# Patient Record
Sex: Female | Born: 1955 | Race: White | Hispanic: No | Marital: Single | Smoking: Never smoker
Health system: Southern US, Community
[De-identification: ages and names within clinical notes are randomized; demographics above are authoritative.]

## PROBLEM LIST (undated history)

## (undated) DIAGNOSIS — K219 Gastro-esophageal reflux disease without esophagitis: Secondary | ICD-10-CM

## (undated) DIAGNOSIS — K429 Umbilical hernia without obstruction or gangrene: Secondary | ICD-10-CM

## (undated) DIAGNOSIS — C801 Malignant (primary) neoplasm, unspecified: Secondary | ICD-10-CM

## (undated) DIAGNOSIS — J45909 Unspecified asthma, uncomplicated: Secondary | ICD-10-CM

## (undated) DIAGNOSIS — K439 Ventral hernia without obstruction or gangrene: Secondary | ICD-10-CM

## (undated) DIAGNOSIS — D219 Benign neoplasm of connective and other soft tissue, unspecified: Secondary | ICD-10-CM

## (undated) DIAGNOSIS — Z862 Personal history of diseases of the blood and blood-forming organs and certain disorders involving the immune mechanism: Secondary | ICD-10-CM

## (undated) DIAGNOSIS — F32A Depression, unspecified: Secondary | ICD-10-CM

## (undated) DIAGNOSIS — N840 Polyp of corpus uteri: Secondary | ICD-10-CM

## (undated) DIAGNOSIS — K5790 Diverticulosis of intestine, part unspecified, without perforation or abscess without bleeding: Secondary | ICD-10-CM

## (undated) DIAGNOSIS — Z8679 Personal history of other diseases of the circulatory system: Secondary | ICD-10-CM

## (undated) DIAGNOSIS — F329 Major depressive disorder, single episode, unspecified: Secondary | ICD-10-CM

## (undated) DIAGNOSIS — T7840XA Allergy, unspecified, initial encounter: Secondary | ICD-10-CM

## (undated) HISTORY — DX: Gastro-esophageal reflux disease without esophagitis: K21.9

## (undated) HISTORY — PX: COLONOSCOPY: SHX174

## (undated) HISTORY — DX: Unspecified asthma, uncomplicated: J45.909

## (undated) HISTORY — PX: VARICOSE VEIN SURGERY: SHX832

## (undated) HISTORY — DX: Allergy, unspecified, initial encounter: T78.40XA

## (undated) HISTORY — PX: DILATION AND CURETTAGE OF UTERUS: SHX78

## (undated) HISTORY — PX: TONSILLECTOMY: SUR1361

## (undated) HISTORY — PX: INCISION AND DRAINAGE ABSCESS: SHX5864

## (undated) HISTORY — DX: Malignant (primary) neoplasm, unspecified: C80.1

## (undated) HISTORY — PX: MOHS SURGERY: SUR867

---
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
## 2004-10-11 ENCOUNTER — Other Ambulatory Visit: Admission: RE | Admit: 2004-10-11 | Discharge: 2004-10-11 | Payer: Self-pay | Admitting: Obstetrics and Gynecology

## 2004-10-23 ENCOUNTER — Ambulatory Visit (HOSPITAL_COMMUNITY): Admission: RE | Admit: 2004-10-23 | Discharge: 2004-10-23 | Payer: Self-pay | Admitting: Obstetrics and Gynecology

## 2004-10-23 ENCOUNTER — Ambulatory Visit (HOSPITAL_BASED_OUTPATIENT_CLINIC_OR_DEPARTMENT_OTHER): Admission: RE | Admit: 2004-10-23 | Discharge: 2004-10-23 | Payer: Self-pay | Admitting: Obstetrics and Gynecology

## 2012-02-18 ENCOUNTER — Ambulatory Visit: Payer: BC Managed Care – PPO

## 2012-02-18 ENCOUNTER — Ambulatory Visit (INDEPENDENT_AMBULATORY_CARE_PROVIDER_SITE_OTHER): Payer: BC Managed Care – PPO | Admitting: Family Medicine

## 2012-02-18 VITALS — BP 97/64 | HR 98 | Temp 99.4°F | Resp 20 | Ht 63.5 in | Wt 175.2 lb

## 2012-02-18 DIAGNOSIS — R0902 Hypoxemia: Secondary | ICD-10-CM

## 2012-02-18 DIAGNOSIS — R062 Wheezing: Secondary | ICD-10-CM

## 2012-02-18 DIAGNOSIS — J189 Pneumonia, unspecified organism: Secondary | ICD-10-CM

## 2012-02-18 MED ORDER — METHYLPREDNISOLONE ACETATE 80 MG/ML IJ SUSP
80.0000 mg | Freq: Once | INTRAMUSCULAR | Status: AC
  Administered 2012-02-18: 80 mg via INTRAMUSCULAR

## 2012-02-18 MED ORDER — LEVOFLOXACIN 500 MG PO TABS: 500.0000 mg | ORAL_TABLET | Freq: Every day | ORAL | Status: AC

## 2012-02-18 MED ORDER — ALBUTEROL SULFATE (5 MG/ML) 0.5% IN NEBU
2.5000 mg | INHALATION_SOLUTION | Freq: Once | RESPIRATORY_TRACT | Status: AC
  Administered 2012-02-18: 2.5 mg via RESPIRATORY_TRACT

## 2012-02-18 MED ORDER — IPRATROPIUM BROMIDE 0.02 % IN SOLN
0.5000 mg | Freq: Once | RESPIRATORY_TRACT | Status: AC
  Administered 2012-02-18: 0.5 mg via RESPIRATORY_TRACT

## 2012-02-18 NOTE — Progress Notes (Signed)
  Subjective:    Patient ID: Jody [name], female    DOB: 24-Feb-1956, 56 y.o.   MRN: [id number]  HPI 56 yo female with history of allergic wheezing/asthma here with cough, wheezing for 2 days.  Started yesterday or day before with some clear runny nose, ear pressure, tickly throat, and cough.  Worsening today and wheezing.  Short of breath today - though feeling a little better now.  Has ventolin inhaler from previous illness but has not tried using it.    Review of Systems Negative except as per HPI     Objective:   Physical Exam  Constitutional: She appears well-developed and well-nourished.  Cardiovascular: Normal rate, regular rhythm, normal heart sounds and intact distal pulses.   No murmur heard. Pulmonary/Chest: Effort normal and breath sounds normal.       Apparent audible wheezing when talking, but on ausculatation, very few wheezes and only mildly decreased breath sounds.  Given one neb treatment of albuterol and atrovent and pulsox up to 92%.   Neurological: She is alert.  Skin: Skin is warm and dry.   [facility] Primary radiology reading by Dr. Georgiana Shore: Increased markings in left lingular and rml area - ? Early pneumonia       Assessment & Plan:  Cough, wheeze, SOB, hypoxia - probably early pneumonia on xray. Depomedrol here for the wheezing and hypoxia.  Levaquin 500 #7 for pneumonia.  F/U in 48 hours if not improving, sooner if worse.

## 2012-11-28 ENCOUNTER — Ambulatory Visit (INDEPENDENT_AMBULATORY_CARE_PROVIDER_SITE_OTHER): Payer: BC Managed Care – PPO | Admitting: Emergency Medicine

## 2012-11-28 VITALS — BP 121/76 | HR 85 | Temp 98.3°F | Resp 16 | Ht 64.0 in | Wt 183.0 lb

## 2012-11-28 DIAGNOSIS — I8390 Asymptomatic varicose veins of unspecified lower extremity: Secondary | ICD-10-CM

## 2012-11-28 NOTE — Progress Notes (Signed)
[facility] [address], [place] [address] [phone]  Date:  11/28/2012   Name:  Jody [name]   DOB:  December 02, 1955   MRN:  [id number]  PCP:  No primary provider on file.    Chief Complaint: Varicose Veins   History of Present Illness:  Jody [name] is a 57 y.o. very pleasant female patient who presents with the following:  Had a spontaneous bleed from a varicosity on her left ankle that she was able to stop with pressure.  Now not bleeding.  No other complaints or concerns  There is no problem list on file for this patient.   Past Medical History  Diagnosis Date  . Allergy   . Cancer     basil cell carcinoma  . GERD (gastroesophageal reflux disease)     Past Surgical History  Procedure Laterality Date  . Tonsillectomy      57 yrs old  . Dilation and curettage of uterus      History  Substance Use Topics  . Smoking status: Never Smoker   . Smokeless tobacco: Not on file  . Alcohol Use: Yes     Comment: once or twice a year    Family History  Problem Relation Age of Onset  . Diabetes Mother   . Hypertension Mother   . Diabetes Father   . Hypertension Father     Allergies  Allergen Reactions  . Aspirin     Bleeding  . Penicillins Hives  . Sulfa Antibiotics Hives    Medication list has been reviewed and updated.  Current Outpatient Prescriptions on File Prior to Visit  Medication Sig Dispense Refill  . Ascorbic Acid (VITAMIN C) 1000 MG tablet Take 1,000 mg by mouth daily. Not sure of dose      . calcium citrate-vitamin D 200-200 MG-UNIT TABS Take 1 tablet by mouth daily.      . Probiotic Product (PROBIOTIC PO) Take by mouth. Ultra-flora balance       No current facility-administered medications on file prior to visit.    Review of Systems:  As per HPI, otherwise negative.    Physical Examination: Filed Vitals:   11/28/12 1504  BP: 121/76  Pulse: 85  Temp: 98.3 F (36.8 C)  Resp: 16   Filed  Vitals:   11/28/12 1504  Height: 5\' 4"  (1.626 m)  Weight: 183 lb (83.008 kg)   Body mass index is 31.4 kg/(m^2). Ideal Body Weight: Weight in (lb) to have BMI = 25: 145.3   GEN: WDWN, NAD, Non-toxic, Alert & Oriented x 3 HEENT: Atraumatic, Normocephalic.  Ears and Nose: No external deformity. EXTR: No clubbing/cyanosis/edema.   Extensive varicosities NEURO: Normal gait.  PSYCH: Normally interactive. Conversant. Not depressed or anxious appearing.  Calm demeanor.    Assessment and Plan: Ruptured varicose vein Follow up with vein clinic  Carmelina Dane, MD

## 2012-12-09 ENCOUNTER — Ambulatory Visit (INDEPENDENT_AMBULATORY_CARE_PROVIDER_SITE_OTHER): Payer: BC Managed Care – PPO | Admitting: Family Medicine

## 2012-12-09 VITALS — BP 109/70 | HR 85 | Temp 98.3°F | Resp 16 | Ht 64.0 in | Wt 184.0 lb

## 2012-12-09 DIAGNOSIS — J302 Other seasonal allergic rhinitis: Secondary | ICD-10-CM | POA: Insufficient documentation

## 2012-12-09 DIAGNOSIS — J309 Allergic rhinitis, unspecified: Secondary | ICD-10-CM

## 2012-12-09 MED ORDER — FLUTICASONE PROPIONATE 50 MCG/ACT NA SUSP: 2.0000 | Freq: Every day | NASAL | Status: DC

## 2012-12-09 NOTE — Patient Instructions (Addendum)
Thank you for coming in today.  I think your symptoms are due to allergies.  Please take Flonase spray for a month.  Take zyrtec daily during the bad allergy times.  Call or go to the emergency room if you get worse, have trouble breathing, have chest pains, or palpitations.  Come back as needed.

## 2012-12-09 NOTE — Progress Notes (Signed)
Jody [name] is a 57 y.o. female who presents to [facility] today for cough, runny nose, sore throat present for several days. Patient notes no fevers shortness of breath chest pain or palpitations. She has tried some Mucinex which has helped a bit. She feels well otherwise. She has a past medical history significant for seasonal allergies. Zyrtec has worked well in the past.   PMH: Reviewed seasonal allergies History  Substance Use Topics  . Smoking status: Never Smoker   . Smokeless tobacco: Not on file  . Alcohol Use: Yes     Comment: once or twice a year   ROS as above  Medications reviewed. Current Outpatient Prescriptions  Medication Sig Dispense Refill  . Ascorbic Acid (VITAMIN C) 1000 MG tablet Take 1,000 mg by mouth daily. Not sure of dose      . calcium citrate-vitamin D 200-200 MG-UNIT TABS Take 1 tablet by mouth daily.      . Cholecalciferol (VITAMIN D) 2000 UNITS tablet Take 2,000 Units by mouth daily.      . Multiple Vitamins-Minerals (CENTRUM SILVER ULTRA WOMENS PO) Take 1 tablet by mouth daily.      . Probiotic Product (PROBIOTIC PO) Take by mouth. Ultra-flora balance      . fluticasone (FLONASE) 50 MCG/ACT nasal spray Place 2 sprays into the nose daily.  16 g  6  . Vitamin Mixture (ESTER-C PO) Take 1 tablet by mouth daily.       No current facility-administered medications for this visit.    Exam:  BP 109/70  Pulse 85  Temp(Src) 98.3 F (36.8 C) (Oral)  Resp 16  Ht 5\' 4"  (1.626 m)  Wt 184 lb (83.462 kg)  BMI 31.57 kg/m2  SpO2 99% Gen: Well NAD HEENT: EOMI,  MMM, cobblestoning posterior pharynx. Normal-appearing tympanic membranes bilaterally. Nontender sinus Lungs: CTABL Nl WOB Heart: RRR no MRG Abd: NABS, NT, ND Exts: Non edematous BL  LE, warm and well perfused.   No results found for this or any previous visit (from the past 72 hour(s)).  Assessment and Plan: 57 y.o. female with seasonal allergies versus viral URI. Symptomatic treatment. Flonase  Zyrtec Call or go to the emergency room if you get worse, have trouble breathing, have chest pains, or palpitations.  Followup as needed

## 2013-05-16 IMAGING — CR DG CHEST 2V
2 series · 2 of 2 positions shown · non-contrast
Comparison: None.

CLINICAL DATA: Cough, wheeze

CHEST - 2 VIEW

[PA]
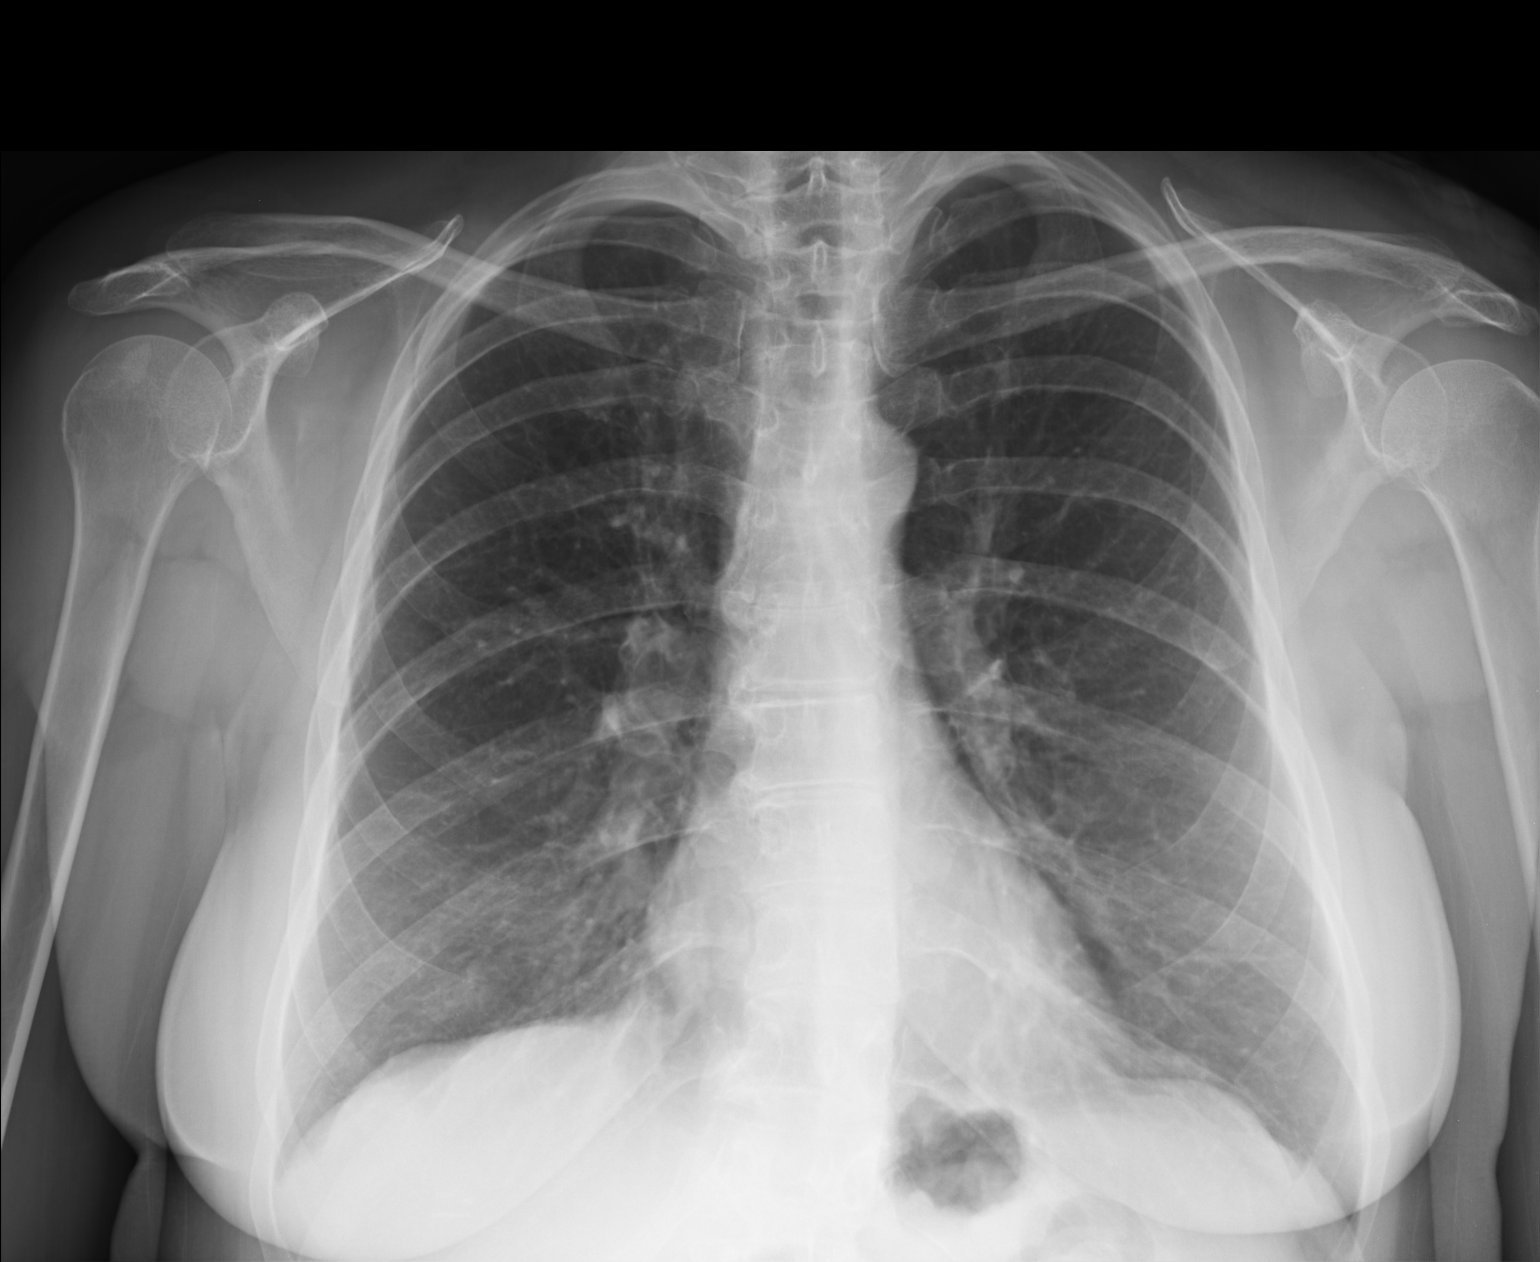

[lateral]
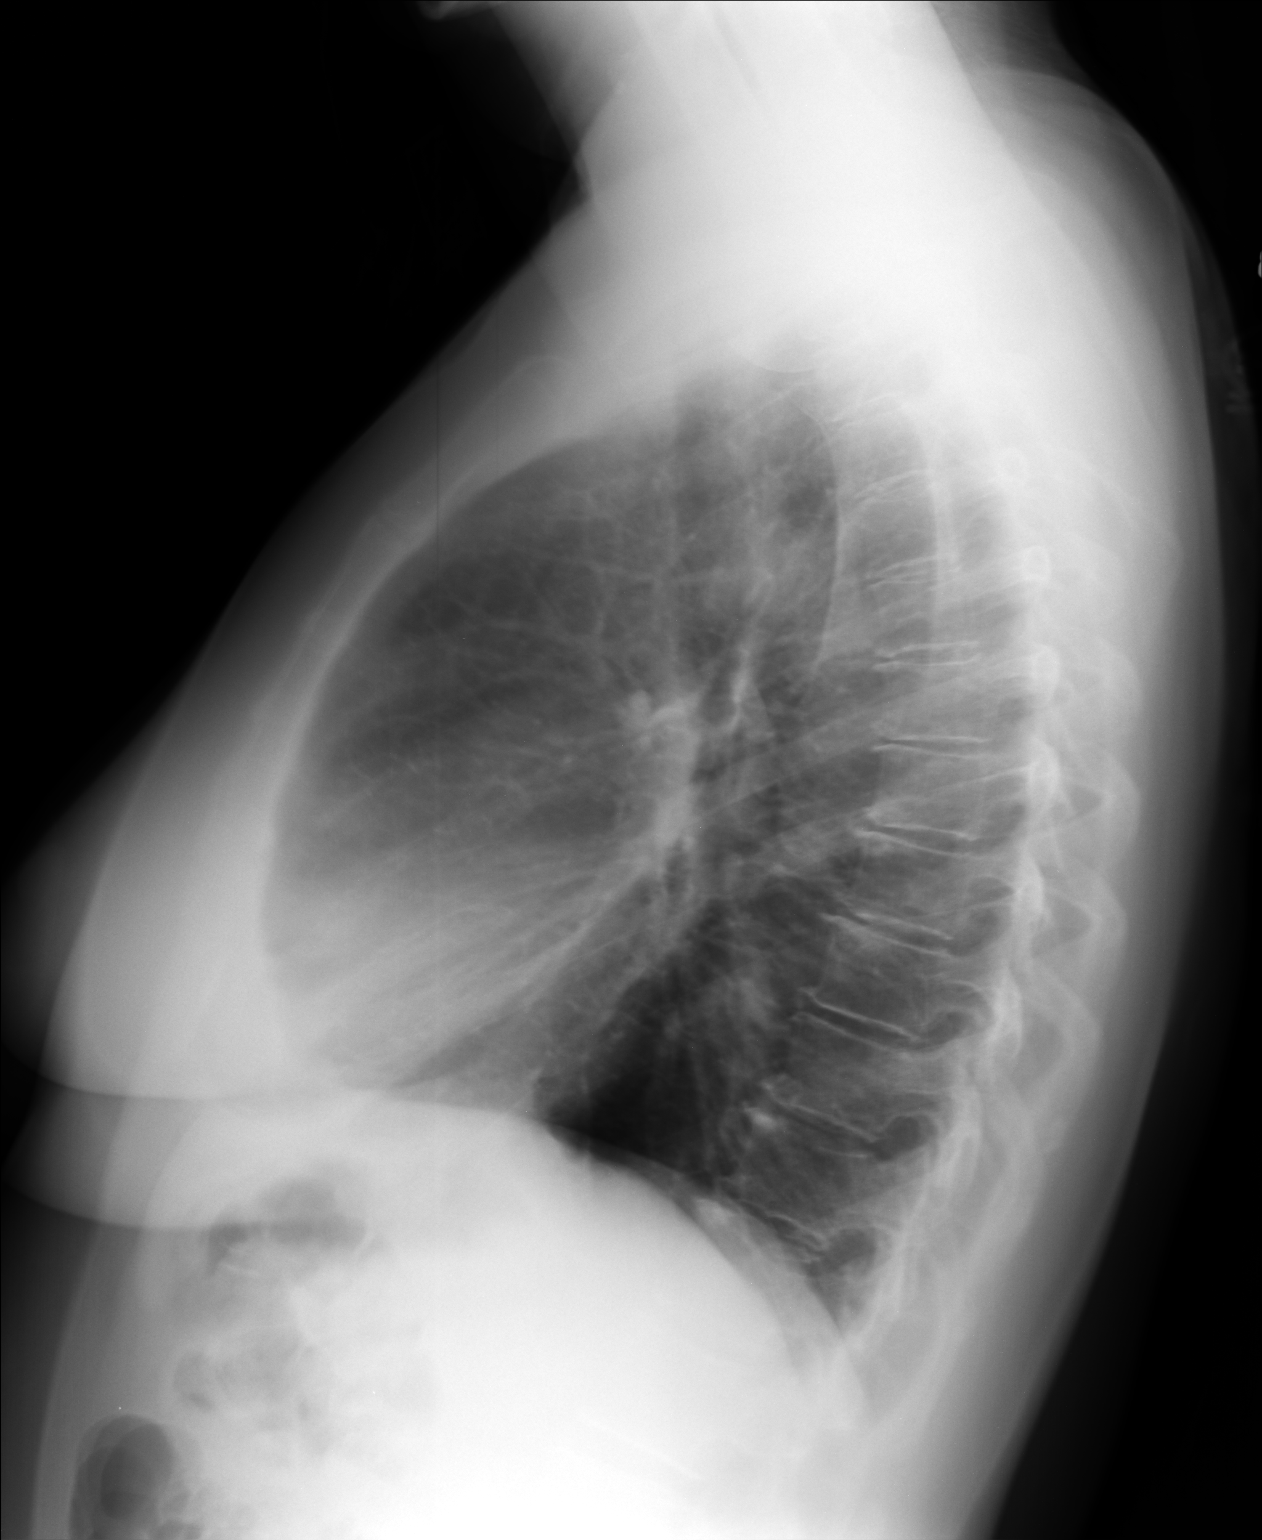

[2 of 2 positions shown; findings below may reference images not displayed]

FINDINGS: Mild coarse infrahilar interstitial markings.  No confluent
airspace infiltrate or overt edema.  Heart size is normal.  No
effusion.  Regional bones unremarkable.
IMPRESSION: 1.  Prominent infrahilar interstitial markings, of uncertain
chronicity.

Clinically significant discrepancy from primary report, if
provided: None

## 2013-07-11 ENCOUNTER — Ambulatory Visit (INDEPENDENT_AMBULATORY_CARE_PROVIDER_SITE_OTHER): Payer: BC Managed Care – PPO | Admitting: Physician Assistant

## 2013-07-11 VITALS — BP 112/82 | HR 66 | Temp 98.0°F | Resp 16 | Ht 63.0 in | Wt 182.4 lb

## 2013-07-11 DIAGNOSIS — J302 Other seasonal allergic rhinitis: Secondary | ICD-10-CM

## 2013-07-11 DIAGNOSIS — R062 Wheezing: Secondary | ICD-10-CM

## 2013-07-11 DIAGNOSIS — J45909 Unspecified asthma, uncomplicated: Secondary | ICD-10-CM | POA: Insufficient documentation

## 2013-07-11 DIAGNOSIS — J309 Allergic rhinitis, unspecified: Secondary | ICD-10-CM

## 2013-07-11 DIAGNOSIS — I8393 Asymptomatic varicose veins of bilateral lower extremities: Secondary | ICD-10-CM | POA: Insufficient documentation

## 2013-07-11 MED ORDER — BECLOMETHASONE DIPROPIONATE 40 MCG/ACT IN AERS: 2.0000 | INHALATION_SPRAY | Freq: Two times a day (BID) | RESPIRATORY_TRACT | Status: DC

## 2013-07-11 MED ORDER — ALBUTEROL SULFATE (2.5 MG/3ML) 0.083% IN NEBU
2.5000 mg | INHALATION_SOLUTION | Freq: Once | RESPIRATORY_TRACT | Status: AC
  Administered 2013-07-11: 2.5 mg via RESPIRATORY_TRACT

## 2013-07-11 MED ORDER — ALBUTEROL SULFATE HFA 108 (90 BASE) MCG/ACT IN AERS: 2.0000 | INHALATION_SPRAY | Freq: Four times a day (QID) | RESPIRATORY_TRACT | Status: DC | PRN

## 2013-07-11 NOTE — Progress Notes (Signed)
[address], [place] [address]   Phone [phone]  Subjective:    Patient ID: Jody [name], female    DOB: 1956-06-03, 57 y.o.   MRN: [id number]  HPI Pt presents to clinic with concerns regarding her SOB and wheezing and needing a refill on her albuterol inhaler.  She got a new inhaler last fall and hers just ran out.  She states that she has to use it most nights due to inability to get a full breath and therefore not be able to fall asleep.  She did go about 2 wks during the summer without having to use it.  She almost never uses in during the day. She has allergies and has been able to use Zyrtec 5mg  daily without side effects.  She does not use the Flonase because she does not want to take medications if she has to.  Currently she feels ok and is not thinking that she needs to use her inhaler.  She has never been diagnosed with asthma but she is trying to clean her home which is really dusty and she has noticed that her wheezing and SOB have been worse since then.  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  Review of Systems  Respiratory: Positive for shortness of breath and wheezing. Negative for cough.   Allergic/Immunologic: Positive for environmental allergies.       Objective:   Physical Exam  Vitals reviewed. Constitutional: She is oriented to person, place, and time. She appears well-developed and well-nourished.  HENT:  Head: Normocephalic and atraumatic.  Right Ear: Hearing, tympanic membrane, external ear and ear canal normal.  Left Ear: Hearing, tympanic membrane, external ear and ear canal normal.  Nose: Mucosal edema (pale and swollen) present.  Mouth/Throat: Uvula is midline, oropharynx is clear and moist and mucous membranes are normal.  Cardiovascular: Normal rate, regular rhythm and normal heart sounds.   No murmur heard. Pulmonary/Chest: She has wheezes (inspiratory and  expiratory wheezing - worse with forced expiration -- wheezing is audible during converstation in the room).  Neurological: She is alert and oriented to person, place, and time.  Skin: Skin is warm and dry.  Psychiatric: She has a normal mood and affect. Her behavior is normal. Judgment and thought content normal.   Pt given an Albuterol neb and she states that she feel great.  Her peak flow improved as did her Pulse Ox.  Only wheezing that remained after the neb was end expiratory wheezing in the RLL with forced expiration.     Assessment & Plan:  Wheezing - Plan: albuterol (PROVENTIL) (2.5 MG/3ML) 0.083% nebulizer solution 2.5 mg, albuterol (PROVENTIL HFA;VENTOLIN HFA) 108 (90 BASE) MCG/ACT inhaler, beclomethasone (QVAR) 40 MCG/ACT inhaler  Bronchospasm - I am unsure at this time if this patient has asthma or if this is reactive to there dust in her home and under-treated allergies.  She will continue her Zyrtec but we will add Qvar to help with lung inflammation. We will recheck her in 2 months and she will keep track of her albuterol use and we change her daily medications as indicated by her albuterol use.  Our goal will be to have her feel the need for albuterol <2x/wk during the day and <2x/month at night.  She will restart her Flonase to help with the allergy component of her bronchospasm.  Benny Lennert PA-C 07/11/2013 11:57 AM

## 2013-07-11 NOTE — Patient Instructions (Addendum)
Reactive airways disease 

## 2013-07-15 NOTE — Progress Notes (Signed)
Left message for pt to schedule 6-8 week f/u with Maralyn Sago.

## 2013-07-23 NOTE — Progress Notes (Signed)
Sent pt reminder letter to schedule f/up appt.

## 2013-09-19 ENCOUNTER — Telehealth: Payer: Self-pay

## 2013-09-19 DIAGNOSIS — R062 Wheezing: Secondary | ICD-10-CM

## 2013-09-19 NOTE — Telephone Encounter (Signed)
PATIENT WOULD LIKE TO ASK SARAH IF SHE SHOULD GET A REFILL ON Q-VAR? SHE SAID HER APPOINTMENT WITH SARAH IS NOT UNTIL JAN. 7TH AND SHE WILL RUN OUT OF IT BEFORE THEN.  BEST PHONE [phone] (HOME)  OR [phone] (CELL)   MBC

## 2013-09-23 MED ORDER — BECLOMETHASONE DIPROPIONATE 40 MCG/ACT IN AERS: 2.0000 | INHALATION_SPRAY | Freq: Two times a day (BID) | RESPIRATORY_TRACT | Status: DC

## 2013-09-23 NOTE — Telephone Encounter (Signed)
I have sent the med to the pharmacy.

## 2013-09-30 NOTE — Telephone Encounter (Signed)
09/30/2013 - AMY - CAN THIS ENCOUNTER BE CLOSED?   THANKS, MBC °

## 2013-10-21 ENCOUNTER — Ambulatory Visit (INDEPENDENT_AMBULATORY_CARE_PROVIDER_SITE_OTHER): Payer: BC Managed Care – PPO | Admitting: Physician Assistant

## 2013-10-21 VITALS — BP 112/62 | HR 81 | Temp 97.8°F | Resp 16 | Ht 64.0 in | Wt 193.0 lb

## 2013-10-21 DIAGNOSIS — J9801 Acute bronchospasm: Secondary | ICD-10-CM

## 2013-10-22 ENCOUNTER — Encounter: Payer: Self-pay | Admitting: Physician Assistant

## 2013-10-22 NOTE — Progress Notes (Signed)
   Subjective:    Patient ID: Jody [name], female    DOB: 1956-07-02, 58 y.o.   MRN: [id number]  HPI Pt is doing really well since the start of her allergy treatment and QVAR.  She has not woken up once with the need to use her albuterol during the night.  She has only used it during the day about 3 times.  She feels great - she is able to be more active with no wheezing and SOB.  This is the best that she has felt in a long time.   Review of Systems  Respiratory: Negative for cough, shortness of breath and wheezing.        Objective:   Physical Exam  Vitals reviewed. Constitutional: She is oriented to person, place, and time. She appears well-developed and well-nourished.  HENT:  Head: Normocephalic and atraumatic.  Right Ear: External ear normal.  Left Ear: External ear normal.  Eyes: Conjunctivae are normal.  Neck: Normal range of motion.  Cardiovascular: Normal rate, regular rhythm and normal heart sounds.   No murmur heard. Pulmonary/Chest: Effort normal and breath sounds normal. She has no wheezes.  Neurological: She is alert and oriented to person, place, and time.  Skin: Skin is warm and dry.  Psychiatric: She has a normal mood and affect. Her behavior is normal. Judgment and thought content normal.       Assessment & Plan:  Bronchospasm - I think patient has asthma though it has never been diagnosed.  She is so much better in regards to her albuterol use since the start of Qvar.  She is currently using it 2 puffs bid and we will decrease it to 1 puff in the am and 2 in the pm over the next week and if her use of albuterol stays low she will decrease it to 1 puff bid.  We will see how her use of albuterol does when spring allergies start.  She will recheck in March.  She will call if she has problems before then for guidance.  Windell Hummingbird PA-C 10/22/2013 7:51 AM

## 2013-12-23 ENCOUNTER — Other Ambulatory Visit: Payer: Self-pay | Admitting: Physician Assistant

## 2013-12-29 ENCOUNTER — Other Ambulatory Visit: Payer: Self-pay

## 2013-12-29 MED ORDER — BECLOMETHASONE DIPROPIONATE 40 MCG/ACT IN AERS: INHALATION_SPRAY | RESPIRATORY_TRACT | Status: DC

## 2014-01-13 ENCOUNTER — Ambulatory Visit (INDEPENDENT_AMBULATORY_CARE_PROVIDER_SITE_OTHER): Payer: BC Managed Care – PPO | Admitting: Physician Assistant

## 2014-01-13 ENCOUNTER — Encounter: Payer: Self-pay | Admitting: Physician Assistant

## 2014-01-13 VITALS — BP 103/66 | HR 75 | Temp 98.0°F | Resp 16 | Ht 63.5 in | Wt 198.0 lb

## 2014-01-13 DIAGNOSIS — J45909 Unspecified asthma, uncomplicated: Secondary | ICD-10-CM

## 2014-01-13 DIAGNOSIS — I8390 Asymptomatic varicose veins of unspecified lower extremity: Secondary | ICD-10-CM

## 2014-01-13 DIAGNOSIS — I8393 Asymptomatic varicose veins of bilateral lower extremities: Secondary | ICD-10-CM

## 2014-01-13 DIAGNOSIS — J309 Allergic rhinitis, unspecified: Secondary | ICD-10-CM

## 2014-01-13 DIAGNOSIS — J302 Other seasonal allergic rhinitis: Secondary | ICD-10-CM

## 2014-01-13 MED ORDER — FLUTICASONE PROPIONATE 50 MCG/ACT NA SUSP: 1.0000 | Freq: Every day | NASAL | Status: DC

## 2014-01-13 NOTE — Progress Notes (Signed)
   Subjective:    Patient ID: Jody [name], female    DOB: [birth year], 58 y.o.   MRN: [id number]  HPI  Pt presents to clinic for a recheck.  She is doing really well.  She has decreased her QVAR to 1 puff bid and she has only used her albuterol once in Jan.  She has felt over the last week that her nasal congestion has increased with the start of allergy season and she has felt slightly SOB and some slight wheezing with activity but it only lasts for a few minutes and then resolved.  She is very happy with the results.  This is the 1st spring that she feels that she can breath.  She is having some floaters in her left eye and has plans to see her eye dr.  Review of Systems     Objective:   Physical Exam  Vitals reviewed. Constitutional: She is oriented to person, place, and time. She appears well-developed and well-nourished.  HENT:  Head: Normocephalic and atraumatic.  Right Ear: Hearing, tympanic membrane, external ear and ear canal normal.  Left Ear: Hearing, tympanic membrane, external ear and ear canal normal.  Nose: Nose normal.  Mouth/Throat: Uvula is midline, oropharynx is clear and moist and mucous membranes are normal.  Eyes: Conjunctivae are normal.  Neck: Normal range of motion.  Cardiovascular: Normal rate, regular rhythm and normal heart sounds.   Pulmonary/Chest: Effort normal and breath sounds normal. She has no wheezes.  Neurological: She is alert and oriented to person, place, and time.  Skin: Skin is warm and dry.  Psychiatric: She has a normal mood and affect. Her behavior is normal. Judgment and thought content normal.       Assessment & Plan:  Extrinsic asthma, unspecified - she will increase her QVAR to 2 puffs bid - use her albuterol if she feels she needs it and she will contact me if her use of albuterol increases.  Seasonal allergies - Plan: fluticasone (FLONASE) 50 MCG/ACT nasal spray  Varicose veins of legs - continue current  treatment  Floaters - she will fu with her ophthalmologist  Windell Hummingbird PA-C  [facility] 01/13/2014 5:05 PM

## 2014-07-21 ENCOUNTER — Ambulatory Visit: Payer: BC Managed Care – PPO | Admitting: Physician Assistant

## 2014-08-04 ENCOUNTER — Telehealth: Payer: Self-pay

## 2014-08-04 ENCOUNTER — Ambulatory Visit (INDEPENDENT_AMBULATORY_CARE_PROVIDER_SITE_OTHER): Payer: BC Managed Care – PPO | Admitting: Family Medicine

## 2014-08-04 ENCOUNTER — Ambulatory Visit (INDEPENDENT_AMBULATORY_CARE_PROVIDER_SITE_OTHER): Payer: BC Managed Care – PPO

## 2014-08-04 VITALS — BP 120/80 | HR 92 | Temp 98.4°F | Resp 16 | Ht 64.0 in | Wt 194.0 lb

## 2014-08-04 DIAGNOSIS — S8992XA Unspecified injury of left lower leg, initial encounter: Secondary | ICD-10-CM

## 2014-08-04 DIAGNOSIS — S79911A Unspecified injury of right hip, initial encounter: Secondary | ICD-10-CM

## 2014-08-04 DIAGNOSIS — S8011XA Contusion of right lower leg, initial encounter: Secondary | ICD-10-CM

## 2014-08-04 DIAGNOSIS — S7001XA Contusion of right hip, initial encounter: Secondary | ICD-10-CM

## 2014-08-04 NOTE — Progress Notes (Signed)
Chief Complaint:  Chief Complaint  Patient presents with  . Bleeding/Bruising    on left leg x fell  5 days ago    HPI: Jody [name] is a 58 y.o. female who is here for  Right hip and right leg injury s/p fall 5 days ago. She tripped over uneven side walk outside of her school and fell and landed on her right hi[, she had her keys and phone in her right pocket, she also hit her forehead but she has been fine, no LOC, no HA, no confusion, no vision changes, no gait changes except today she had increase bruising and also pain in her leg and hip. She states the bruise was much smaller and she did not have pain.  She thought she was feeling better but now she has increase brusiing and also pain when she walks. deneis any numbness, weakness, tingling. She is not on any blood thinners. 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  Past Medical History  Diagnosis Date  . Allergy   . Cancer     basil cell carcinoma  . GERD (gastroesophageal reflux disease)    Past Surgical History  Procedure Laterality Date  . Tonsillectomy      58 yrs old  . Dilation and curettage of uterus     History   Social History  . Marital Status: Single    Spouse Name: N/A    Number of Children: N/A  . Years of Education: N/A   Social History Main Topics  . Smoking status: Never Smoker   . Smokeless tobacco: None  . Alcohol Use: Yes     Comment: once or twice a year  . Drug Use: No  . Sexual Activity: No   Other Topics Concern  . None   Social History Narrative  . None   Family History  Problem Relation Age of Onset  . Diabetes Mother   . Hypertension Mother   . Diabetes Father   . Hypertension Father    Allergies  Allergen Reactions  . Aspirin     Bleeding  . Penicillins Hives  . Sulfa Antibiotics Hives   Prior to Admission medications   Medication Sig Start Date End Date Taking? Authorizing Provider  albuterol (PROVENTIL HFA;VENTOLIN HFA) 108 (90 BASE) MCG/ACT inhaler  Inhale 2 puffs into the lungs every 6 (six) hours as needed for wheezing. 07/11/13  Yes Mancel Bale, PA-C  beclomethasone (QVAR) 40 MCG/ACT inhaler Inhale 1 puff into the lungs every AM and 2 puffs every PM 12/29/13  Yes Mancel Bale, PA-C  calcium citrate-vitamin D (CITRACAL+D) 315-200 MG-UNIT per tablet Take 1 tablet by mouth 2 (two) times daily.   Yes Historical Provider, MD  cetirizine (ZYRTEC) 10 MG tablet Take 10 mg by mouth daily.   Yes Historical Provider, MD  Cholecalciferol (VITAMIN D) 2000 UNITS tablet Take 2,000 Units by mouth daily.   Yes Historical Provider, MD  fluticasone (FLONASE) 50 MCG/ACT nasal spray Place 1-2 sprays into both nostrils daily. 01/13/14  Yes Mancel Bale, PA-C  Multiple Vitamins-Minerals (CENTRUM SILVER ULTRA WOMENS PO) Take 1 tablet by mouth daily.   Yes Historical Provider, MD  Probiotic Product (PROBIOTIC PO) Take by mouth. Ultra-flora balance   Yes Historical Provider, MD  sodium chloride (OCEAN) 0.65 % nasal spray Place 1 spray into the nose as needed for congestion.   Yes Historical Provider, MD  Vitamin Mixture (ESTER-C PO) Take 1 tablet by mouth daily.  Yes Historical Provider, MD     ROS: The patient denies fevers, chills, night sweats, unintentional weight loss, chest pain, palpitations, wheezing, dyspnea on exertion, nausea, vomiting, abdominal pain, dysuria, hematuria, melena, numbness, weakness, or tingling.   All other systems have been reviewed and were otherwise negative with the exception of those mentioned in the HPI and as above.    PHYSICAL EXAM: Filed Vitals:   08/04/14 1823  BP: 120/80  Pulse: 92  Temp: 98.4 F (36.9 C)  Resp: 16   Filed Vitals:   08/04/14 1823  Height: 5\' 4"  (1.626 m)  Weight: 194 lb (87.998 kg)   Body mass index is 33.28 kg/(m^2).  General: Alert, no acute distress HEENT:  Normocephalic, atraumatic, oropharynx patent. EOMI, PERRLA, fundoscopic exam is normal Cardiovascular:  Regular rate and rhythm, no  rubs murmurs or gallops.  No Carotid bruits, radial pulse intact. No pedal edema.  Respiratory: Clear to auscultation bilaterally.  No wheezes, rales, or rhonchi.  No cyanosis, no use of accessory musculature GI: No organomegaly, abdomen is soft and non-tender, positive bowel sounds.  No masses. Skin: + large right thigh hematoma with blue and purple ecchymosis, it is slightly firm, she is tender, she has green yellow ecchymosis on right forehead Neurologic: Facial musculature symmetric. Psychiatric: Patient is appropriate throughout our interaction. Lymphatic: No cervical lymphadenopathy Musculoskeletal: Gait intact. Right hip-full ROM, 5/5 strength, 2/2 DTRs of knee, sensation intact Right thigh-+ large hematoma, tender Neg [place]   LABS: No results found for this or any previous visit.   EKG/XRAY:   Primary read interpreted by Dr. Marin Comment at [facility]. Neg for fx or dislocation    ASSESSMENT/PLAN: Encounter Diagnoses  Name Primary?  . Hip injury, right, initial encounter   . Leg injury, left, initial encounter   . Hematoma of leg, right, initial encounter Yes  . Contusion, hip, right, initial encounter    Warm compresses Advance activities as tolerated Precautions given, I do not suspect there is any compartment syndrome or clot , it is just a large hematoma F/u prn  Gross sideeffects, risk and benefits, and alternatives of medications d/w patient. Patient is aware that all medications have potential sideeffects and we are unable to predict every sideeffect or drug-drug interaction that may occur.  LE, [place], DO 08/04/2014 7:50 PM

## 2014-08-04 NOTE — Patient Instructions (Signed)

## 2014-08-04 NOTE — Telephone Encounter (Signed)
Pt called saying she fell on Friday. She is having increased pain in her hip. She wanted to know what she could do until she was able to come to the clinic after she gets off work today. I told her she can put some ice and see if that helps with the swelling. She tried resting her hip over the weekend. Pt plans on coming in this afternoon to be seen.

## 2014-08-12 ENCOUNTER — Ambulatory Visit (INDEPENDENT_AMBULATORY_CARE_PROVIDER_SITE_OTHER): Payer: BC Managed Care – PPO | Admitting: Family Medicine

## 2014-08-12 VITALS — BP 122/82 | HR 97 | Temp 99.1°F | Resp 16 | Ht 64.0 in | Wt 194.4 lb

## 2014-08-12 DIAGNOSIS — J069 Acute upper respiratory infection, unspecified: Secondary | ICD-10-CM

## 2014-08-12 DIAGNOSIS — J45901 Unspecified asthma with (acute) exacerbation: Secondary | ICD-10-CM

## 2014-08-12 DIAGNOSIS — S8011XD Contusion of right lower leg, subsequent encounter: Secondary | ICD-10-CM

## 2014-08-12 MED ORDER — BECLOMETHASONE DIPROPIONATE 40 MCG/ACT IN AERS: INHALATION_SPRAY | RESPIRATORY_TRACT | Status: DC

## 2014-08-12 MED ORDER — AZITHROMYCIN 250 MG PO TABS: ORAL_TABLET | ORAL | Status: DC

## 2014-08-12 NOTE — Progress Notes (Signed)
Subjective: 58 year old lady who is here last week with a hematoma right thigh. Since that time over the weekend she got a upper respiratory infection. Actually started more down in her chest and moved up into her head. She's been very congested. Blunting some yellowish mucus and coughing up some but not any greenish stuff. She does not smoke. She does have a history of asthma and uses a Qvar inhaler or regular basis. She has a rescue inhaler. She has not been febrile. 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 She also is here for follow-up with regard to the hematoma of her thigh. This has continued to be there, with extensive bruising extending from the right hip and thigh area down to the upper part of her calf just below her knee. She was scheduled to have further vein injections done next week, but we discussed delaying that.  Objective: Peak flow was a little bit low, 275 with a predicted value about 75 higher. Her throat was clear. TMs normal. Neck supple without significant nodes. Chest has a soft end expiratory wheeze with prolongation of expiration. Heart regular. The hematoma on her thigh was 10 x 15 cm in main area of induration, with extensive bruising surrounding it as discussed above.  Assessment: Hematoma right thigh  upper respiratory infection Asthmatic bronchitis  Plan: This appears to be a viral illness at this time.  She is doing worse and not starting to improve more by this weekend she should take a round of azithromycin. Advised giving it a few more days to start coming down. She is going to use her inhalers a little more frequently as we discussed. Return if further problems.  The thigh will resolve in time. However if it unexpectedly gets worse she is to return.

## 2014-08-12 NOTE — Patient Instructions (Signed)
Drink plenty of fluids  Get sufficient rest  Increase the Qvar to 2 inhalations twice daily for 5 days, then gradually taper off as we discussed  If worsening infection, or not doing any better by Saturday or Sunday, take the azithromycin as directed.  The hematoma on the leg should gradually resolve. It will take a long time, but if abruptly worsen any time please return.

## 2014-11-16 ENCOUNTER — Ambulatory Visit (INDEPENDENT_AMBULATORY_CARE_PROVIDER_SITE_OTHER): Payer: BC Managed Care – PPO

## 2014-11-16 ENCOUNTER — Ambulatory Visit (INDEPENDENT_AMBULATORY_CARE_PROVIDER_SITE_OTHER): Payer: BC Managed Care – PPO | Admitting: Physician Assistant

## 2014-11-16 VITALS — BP 104/66 | HR 89 | Temp 99.0°F | Resp 16 | Ht 64.0 in | Wt 182.0 lb

## 2014-11-16 DIAGNOSIS — R059 Cough, unspecified: Secondary | ICD-10-CM

## 2014-11-16 DIAGNOSIS — R0989 Other specified symptoms and signs involving the circulatory and respiratory systems: Secondary | ICD-10-CM

## 2014-11-16 DIAGNOSIS — J309 Allergic rhinitis, unspecified: Secondary | ICD-10-CM

## 2014-11-16 DIAGNOSIS — K219 Gastro-esophageal reflux disease without esophagitis: Secondary | ICD-10-CM

## 2014-11-16 DIAGNOSIS — I8393 Asymptomatic varicose veins of bilateral lower extremities: Secondary | ICD-10-CM

## 2014-11-16 DIAGNOSIS — R05 Cough: Secondary | ICD-10-CM

## 2014-11-16 MED ORDER — BENZONATATE 100 MG PO CAPS: 100.0000 mg | ORAL_CAPSULE | Freq: Three times a day (TID) | ORAL | Status: DC | PRN

## 2014-11-16 MED ORDER — RANITIDINE HCL 150 MG PO TABS: 150.0000 mg | ORAL_TABLET | Freq: Two times a day (BID) | ORAL | Status: DC

## 2014-11-16 NOTE — Patient Instructions (Addendum)
Your chest xray was normal today. I think your cough may be due to ongoing viral upper respiratory infections, allergic rhinitis, or gerd. For your allergies, please continue to use the flonase but do 2 sprays in each nostril once daily. Try using one whole pill zyrtec daily for allergies. Controlling your allergies may help to relieve the congestion which may be causing the cough. Please take the zantac twice daily for possible acid reflux. If this measure works we will likely continue the medication for 8 weeks. If it doesn't improve the cough you can stop the medication after a few weeks. Please take the tessalon up to every 8 hours as needed for cough.  Please use your albuterol inhaler once tonight when you get home and once a day over the next couple days to see if this helps.  Please return to clinic if the cough persists for the next week. Please pick up a new thermometer and make sure you're not continuing to have fevers at home. Please return to clinic if you are.

## 2014-11-16 NOTE — Progress Notes (Signed)
Subjective:    Patient ID: Jody [name], female    DOB: 09/21/1956, 59 y.o.   MRN: [id number]  PCP: Leotis Pain, DO  Chief Complaint  Patient presents with  . Cough  . Chills  . Fever   Patient Active Problem List   Diagnosis Date Noted  . Extrinsic asthma, unspecified 01/13/2014  . Reactive airway disease 07/11/2013  . Varicose veins of legs 07/11/2013  . Seasonal allergies 12/09/2012   Medications, allergies, past medical history, surgical history, family history, social history and problem list reviewed and updated.  HPI  10 yof with pmh asthma well controlled on qvar and allergic rhinitis presents with one wk cough.  Hx: Seen 3 months ago by Dr Linna Darner. Azithro for [employer]. Sx resolved for 2 months. Approx one month ago had one week of uri sx with non prod cough which resolved. Then had another week of non prod cough 2 wks ago.   Today she presents with another episode uri sx. Sx started one week ago with another round of cough. Mildly prod. Has been ongoing throughout day. She mentions a sour taste in her mouth at times with the cough. Also having head congestion past week. No ear congestion. No sore throat, abd pain, N/V, diarrhea. No otalgia. No body aches. Temp at home 5 days ago was 101. Temp last night 103 and this am at home 103. Has had numerous episodes chills past few days. Wondering if her thermometer is accurate. No fever today.   Has hx allergies. Uses one spray flonase daily at night. Takes 1/2 zyrtec daily. She denies any sneezing, itchy/watery eyes.   Has asthma. Uses qvar daily. No wheezing at home. Albuterol only every 2-3 months prn.   She mentions hx reflux sx. Has never tried anti reflux medication.   She denies any night sweats, unintentional wt loss.   Review of Systems No cp, sob, dysuria.     Objective:   Physical Exam  Constitutional: She is oriented to person, place, and time. She appears well-developed and well-nourished.   Non-toxic appearance. She does not have a sickly appearance. She does not appear ill. No distress.  BP 104/66 mmHg  Pulse 89  Temp(Src) 99 F (37.2 C) (Oral)  Resp 16  Ht 5\' 4"  (1.626 m)  Wt 182 lb (82.555 kg)  BMI 31.22 kg/m2  SpO2 97%   HENT:  Right Ear: Tympanic membrane normal.  Left Ear: Tympanic membrane is not erythematous. A middle ear effusion is present.  Nose: Mucosal edema and rhinorrhea present. Right sinus exhibits maxillary sinus tenderness. Right sinus exhibits no frontal sinus tenderness. Left sinus exhibits maxillary sinus tenderness. Left sinus exhibits no frontal sinus tenderness.  Mouth/Throat: Uvula is midline and oropharynx is clear and moist. No oropharyngeal exudate, posterior oropharyngeal edema, posterior oropharyngeal erythema or tonsillar abscesses.  Mild maxillary ttp bilaterally.   Eyes: Conjunctivae and EOM are normal. Pupils are equal, round, and reactive to light.  Neck: Trachea normal. No Brudzinski's sign noted.  Cardiovascular: Normal rate, regular rhythm and normal heart sounds.  Exam reveals no gallop.   No murmur heard. Pulmonary/Chest: Effort normal and breath sounds normal. She has no decreased breath sounds. She has no wheezes. She has no rhonchi. She has no rales.  Lymphadenopathy:       Head (right side): No submental, no submandibular and no tonsillar adenopathy present.       Head (left side): No submental, no submandibular and no tonsillar adenopathy present.  She has no cervical adenopathy.  Neurological: She is alert and oriented to person, place, and time.  Psychiatric: She has a normal mood and affect. Her speech is normal and behavior is normal.   [facility] reading (PRIMARY) by  Dr. Laney Pastor. Findings: Normal. No acute findings.     Assessment & Plan:   73 yof with pmh asthma well controlled on qvar and allergic rhinitis presents with one wk cough.  Cough - Plan: DG Chest 2 View, ranitidine (ZANTAC) 150 MG tablet, benzonatate  (TESSALON) 100 MG capsule, DISCONTINUED: benzonatate (TESSALON) 100 MG capsule --cxr obtained as cough has been intermittent for greater than one month --cxr negative, lung sounds/vitals normal --tessalon prn --suspect cough due to issues below --no wheezing in clinic and pt feels asthma has been well controlled at home, doubt asthma cause of cough  Gastroesophageal reflux disease, esophagitis presence not specified - Plan: ranitidine (ZANTAC) 150 MG tablet --gerd sx few times week for several yrs --assoc sour taste in mouth with cough --basic gerd prevention instructions given --zantac bid trial  Allergic rhinitis, unspecified allergic rhinitis type --c/o head/ear congestion, left tm congestion on exam today --encouraged to increase flonase to 2 puffs qd instead of one --as had only been taking 1/2 zyrtec due to assoc fatigue, encouraged to switch to claritin one whole pill qd --decongestant prn  Julieta Gutting, PA-C Physician Assistant-Certified [facility]  11/17/2014 3:06 PM

## 2015-03-22 ENCOUNTER — Encounter: Payer: Self-pay | Admitting: *Deleted

## 2015-04-05 ENCOUNTER — Telehealth: Payer: Self-pay

## 2015-04-05 DIAGNOSIS — J302 Other seasonal allergic rhinitis: Secondary | ICD-10-CM

## 2015-04-05 DIAGNOSIS — J45901 Unspecified asthma with (acute) exacerbation: Secondary | ICD-10-CM

## 2015-04-05 MED ORDER — BECLOMETHASONE DIPROPIONATE 40 MCG/ACT IN AERS: INHALATION_SPRAY | RESPIRATORY_TRACT | Status: DC

## 2015-04-05 MED ORDER — FLUTICASONE PROPIONATE 50 MCG/ACT NA SUSP: 1.0000 | Freq: Every day | NASAL | Status: DC

## 2015-04-05 NOTE — Telephone Encounter (Signed)
Refills sent

## 2015-04-05 NOTE — Telephone Encounter (Signed)
Patient will run out of her medication b/4 her appt w/[facility]. fluticasone (FLONASE) 50 MCG/ACT nasal spray beclomethasone (QVAR) 40 MCG/ACT inhaler  [facility] [employer]   [phone]

## 2015-04-20 ENCOUNTER — Ambulatory Visit (INDEPENDENT_AMBULATORY_CARE_PROVIDER_SITE_OTHER): Payer: BC Managed Care – PPO | Admitting: Physician Assistant

## 2015-04-20 ENCOUNTER — Encounter: Payer: Self-pay | Admitting: Physician Assistant

## 2015-04-20 VITALS — BP 106/66 | HR 75 | Temp 98.3°F | Resp 16 | Ht 64.0 in | Wt 183.4 lb

## 2015-04-20 DIAGNOSIS — J45901 Unspecified asthma with (acute) exacerbation: Secondary | ICD-10-CM | POA: Diagnosis not present

## 2015-04-20 DIAGNOSIS — J302 Other seasonal allergic rhinitis: Secondary | ICD-10-CM | POA: Diagnosis not present

## 2015-04-20 DIAGNOSIS — R062 Wheezing: Secondary | ICD-10-CM | POA: Diagnosis not present

## 2015-04-20 MED ORDER — ALBUTEROL SULFATE HFA 108 (90 BASE) MCG/ACT IN AERS: 2.0000 | INHALATION_SPRAY | Freq: Four times a day (QID) | RESPIRATORY_TRACT | Status: DC | PRN

## 2015-04-20 MED ORDER — BECLOMETHASONE DIPROPIONATE 40 MCG/ACT IN AERS: 1.0000 | INHALATION_SPRAY | Freq: Two times a day (BID) | RESPIRATORY_TRACT | Status: DC

## 2015-04-20 MED ORDER — FLUTICASONE PROPIONATE 50 MCG/ACT NA SUSP: 1.0000 | Freq: Every day | NASAL | Status: DC

## 2015-04-20 NOTE — Patient Instructions (Signed)
mucinex - over the counter for when you get congestion - get the blue box (brand name) max strength - 1 pill 2x/day

## 2015-04-20 NOTE — Progress Notes (Signed)
   Jody [name]  MRN: [id number] DOB: [birth year]  Subjective:  Pt presents to clinic for recheck.  She is doing really well.  She has not had to use her inhaler but once since November and it was when she had a cold.  She is really happy with her treatment at this time.  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  She is planning on getting caught up with her health maintanence and plans to schedule a CPE in the future with me.  Patient Active Problem List   Diagnosis Date Noted  . Extrinsic asthma, unspecified 01/13/2014  . Reactive airway disease 07/11/2013  . Varicose veins of legs 07/11/2013  . Seasonal allergies 12/09/2012    Current Outpatient Prescriptions on File Prior to Visit  Medication Sig Dispense Refill  . calcium citrate-vitamin D (CITRACAL+D) 315-200 MG-UNIT per tablet Take 1 tablet by mouth 2 (two) times daily.    . Cholecalciferol (VITAMIN D) 2000 UNITS tablet Take 2,000 Units by mouth daily.    . Multiple Vitamins-Minerals (CENTRUM SILVER ULTRA WOMENS PO) Take 1 tablet by mouth daily.    . Probiotic Product (PROBIOTIC PO) Take by mouth. Ultra-flora balance    . sodium chloride (OCEAN) 0.65 % nasal spray Place 1 spray into the nose as needed for congestion.    . Vitamin Mixture (ESTER-C PO) Take 1 tablet by mouth daily.     No current facility-administered medications on file prior to visit.    Allergies  Allergen Reactions  . Aspirin     Bleeding  . Penicillins Hives  . Sulfa Antibiotics Hives    Review of Systems  Constitutional: Negative for fever and chills.  Respiratory: Negative for cough and shortness of breath.    Objective:  BP 106/66 mmHg  Pulse 75  Temp(Src) 98.3 F (36.8 C) (Oral)  Resp 16  Ht 5\' 4"  (1.626 m)  Wt 183 lb 6.4 oz (83.19 kg)  BMI 31.47 kg/m2  SpO2 98%  Physical Exam  Constitutional: She is oriented to person, place, and time and well-developed, well-nourished, and in no distress.   HENT:  Head: Normocephalic and atraumatic.  Right Ear: Hearing and external ear normal.  Left Ear: Hearing and external ear normal.  Eyes: Conjunctivae are normal.  Neck: Normal range of motion.  Cardiovascular: Normal rate, regular rhythm and normal heart sounds.   No murmur heard. Pulmonary/Chest: Effort normal.  Neurological: She is alert and oriented to person, place, and time. Gait normal.  Skin: Skin is warm and dry.  Psychiatric: Mood, memory, affect and judgment normal.  Vitals reviewed.   Assessment and Plan :  Asthmatic bronchitis with acute exacerbation - Plan: beclomethasone (QVAR) 40 MCG/ACT inhaler  Wheezing - Plan: albuterol (PROVENTIL HFA;VENTOLIN HFA) 108 (90 BASE) MCG/ACT inhaler  Seasonal allergies - Plan: fluticasone (FLONASE) 50 MCG/ACT nasal spray  Continue her current medication regimens.  F/u with me in a few months for a CPE.  Windell Hummingbird PA-C  [facility] 04/20/2015 4:56 PM

## 2016-02-12 IMAGING — CR DG CHEST 2V
2 series · 2 of 2 positions shown · non-contrast
Comparison: 02/18/2012

CLINICAL DATA: Shortness of breath. Cough. Abnormal lung sounds.
Right lower lobe wheezing and rhonchi.

EXAM:
CHEST  2 VIEW

[PA]
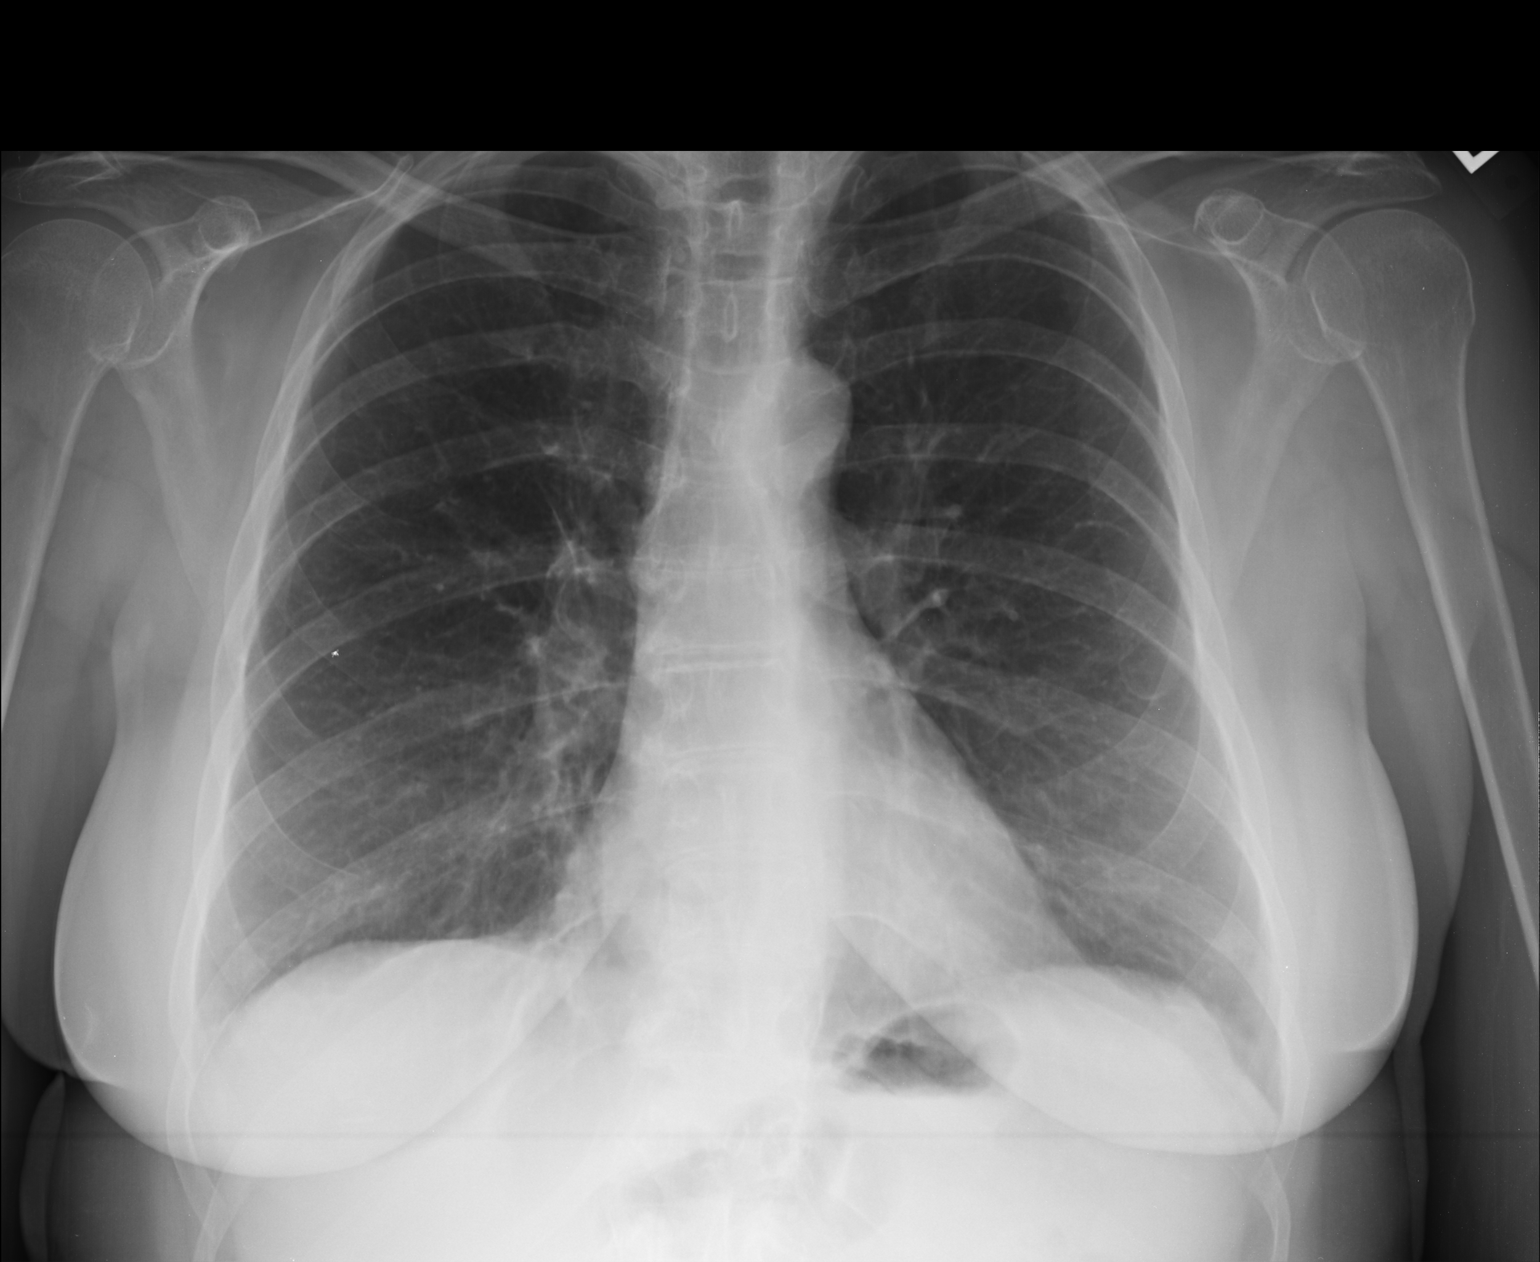

[lateral]
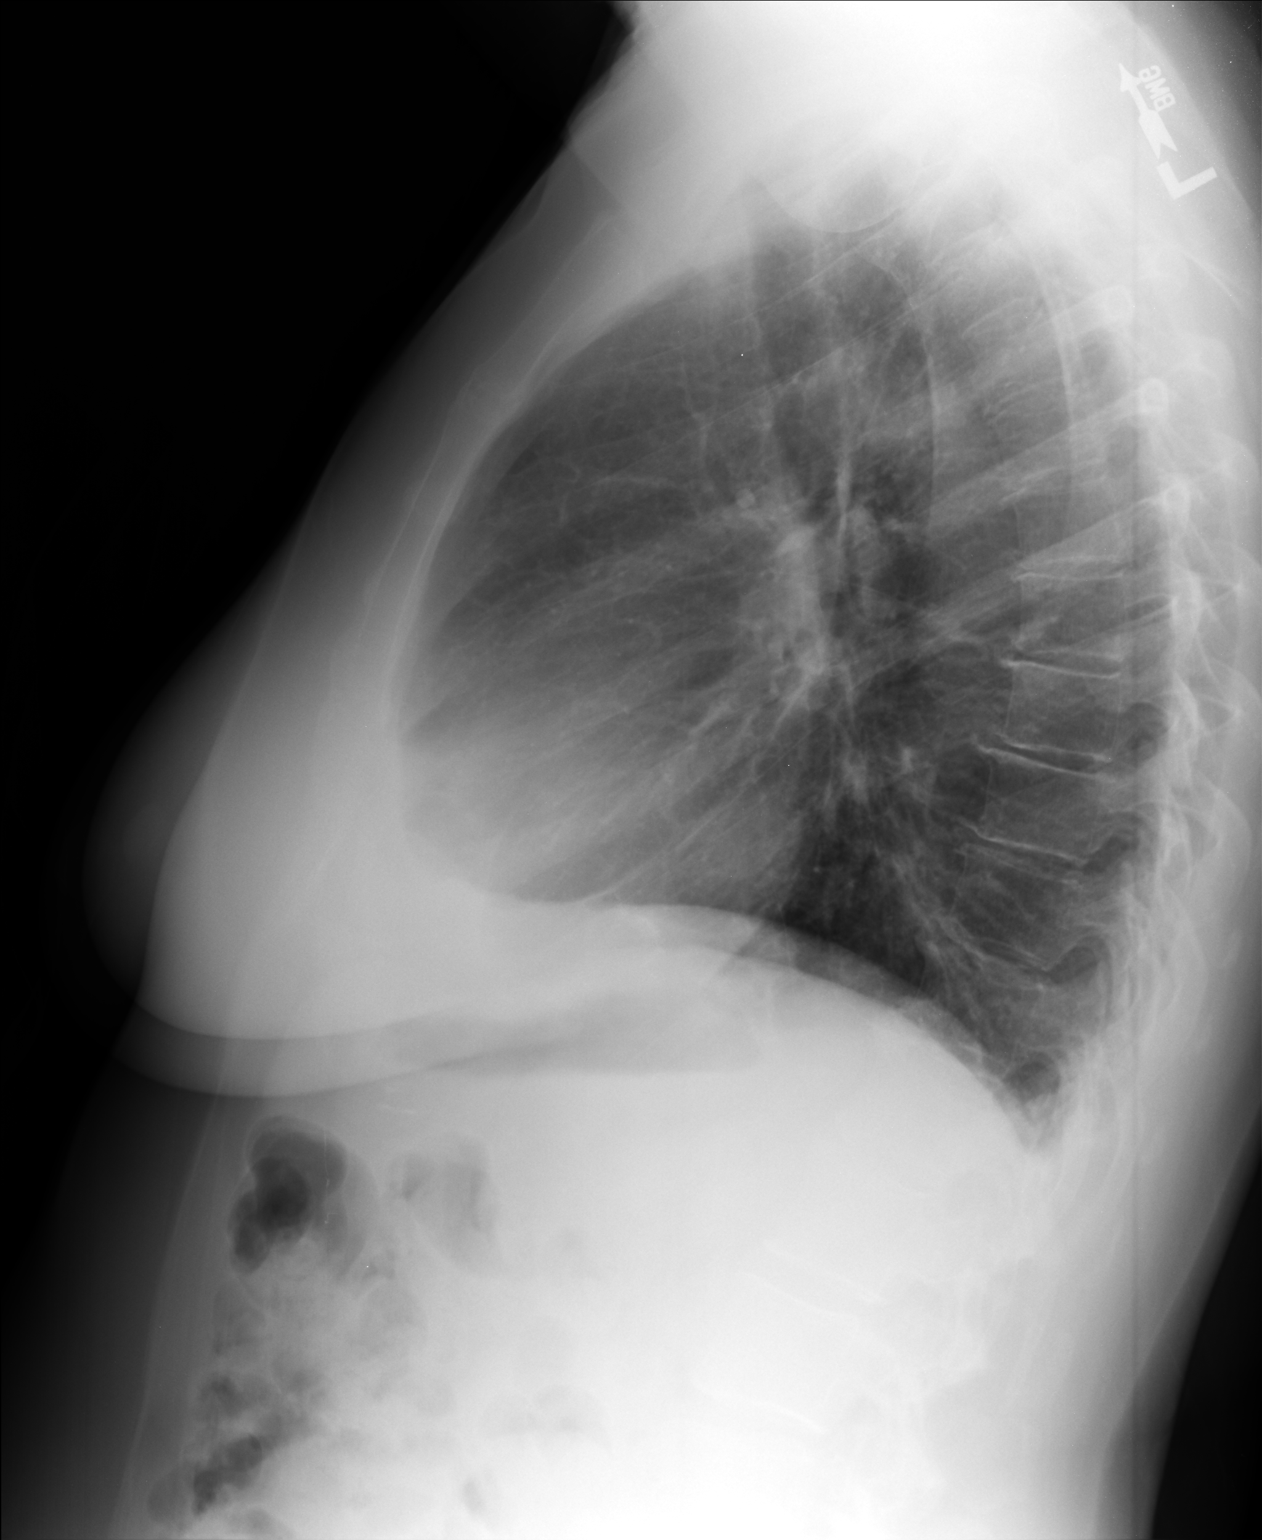

[2 of 2 positions shown; findings below may reference images not displayed]

FINDINGS: The heart size and mediastinal contours are within normal limits.
Both lungs are clear. The visualized skeletal structures are
unremarkable.
IMPRESSION: No active cardiopulmonary disease.

## 2016-07-02 ENCOUNTER — Other Ambulatory Visit: Payer: Self-pay | Admitting: Physician Assistant

## 2016-07-02 DIAGNOSIS — J45901 Unspecified asthma with (acute) exacerbation: Secondary | ICD-10-CM

## 2016-10-02 ENCOUNTER — Ambulatory Visit (INDEPENDENT_AMBULATORY_CARE_PROVIDER_SITE_OTHER): Payer: BC Managed Care – PPO | Admitting: Physician Assistant

## 2016-10-02 ENCOUNTER — Encounter: Payer: Self-pay | Admitting: Physician Assistant

## 2016-10-02 DIAGNOSIS — R062 Wheezing: Secondary | ICD-10-CM

## 2016-10-02 DIAGNOSIS — J302 Other seasonal allergic rhinitis: Secondary | ICD-10-CM | POA: Diagnosis not present

## 2016-10-02 DIAGNOSIS — J4531 Mild persistent asthma with (acute) exacerbation: Secondary | ICD-10-CM | POA: Diagnosis not present

## 2016-10-02 MED ORDER — FLUTICASONE PROPIONATE 50 MCG/ACT NA SUSP: 1.0000 | Freq: Every day | NASAL | 1 refills | Status: DC

## 2016-10-02 MED ORDER — BECLOMETHASONE DIPROPIONATE 40 MCG/ACT IN AERS: 1.0000 | INHALATION_SPRAY | Freq: Two times a day (BID) | RESPIRATORY_TRACT | 12 refills | Status: DC

## 2016-10-02 MED ORDER — ALBUTEROL SULFATE HFA 108 (90 BASE) MCG/ACT IN AERS: 2.0000 | INHALATION_SPRAY | Freq: Four times a day (QID) | RESPIRATORY_TRACT | 0 refills | Status: DC | PRN

## 2016-10-02 NOTE — Progress Notes (Signed)
Jody [name]  MRN: [id number] DOB: [birth year]  Subjective:  Pt presents to clinic for medication refill.  She has decreased her medication since her last visit and was feeling really good on Qvar once at night - she has slowly decreased down from 2 bid.  She started feeling slightly SOB and irritable in her lungs when she went to every other day when she was running out but could not get in for a visit.  She has stopped the flonase for >6 months and has done well.  She has never used her albuterol but she has it at home.  She has the flonase at home for if she needs it.    Review of Systems  Respiratory: Negative for cough, shortness of breath and wheezing.     Patient Active Problem List   Diagnosis Date Noted  . Extrinsic asthma, unspecified 01/13/2014  . Reactive airway disease 07/11/2013  . Varicose veins of legs 07/11/2013  . Seasonal allergies 12/09/2012    Current Outpatient Prescriptions on File Prior to Visit  Medication Sig Dispense Refill  . calcium citrate-vitamin D (CITRACAL+D) 315-200 MG-UNIT per tablet Take 1 tablet by mouth 2 (two) times daily.    . Cholecalciferol (VITAMIN D) 2000 UNITS tablet Take 2,000 Units by mouth daily.    . Multiple Vitamins-Minerals (CENTRUM SILVER ULTRA WOMENS PO) Take 1 tablet by mouth daily.    . Probiotic Product (PROBIOTIC PO) Take by mouth. Ultra-flora balance    . sodium chloride (OCEAN) 0.65 % nasal spray Place 1 spray into the nose as needed for congestion.    . Vitamin Mixture (ESTER-C PO) Take 1 tablet by mouth daily.     No current facility-administered medications on file prior to visit.     Allergies  Allergen Reactions  . Aspirin     Bleeding  . Penicillins Hives  . Sulfa Antibiotics Hives    Pt patients past, family and social history were reviewed and updated.   Objective:  BP (!) 110/58 (BP Location: Left Arm, Patient Position: Sitting, Cuff Size: Large)   Pulse 80   Temp 97.7 F (36.5 C) (Oral)    Resp 16   Ht 5' 3.5" (1.613 m)   Wt 209 lb 4.8 oz (94.9 kg)   SpO2 98%   BMI 36.49 kg/m   Physical Exam  Constitutional: She is oriented to person, place, and time and well-developed, well-nourished, and in no distress.  HENT:  Head: Normocephalic and atraumatic.  Right Ear: Hearing and external ear normal.  Left Ear: Hearing and external ear normal.  Eyes: Conjunctivae are normal.  Neck: Normal range of motion.  Cardiovascular: Normal rate, regular rhythm and normal heart sounds.   No murmur heard. Pulmonary/Chest: Effort normal and breath sounds normal. She has no wheezes.  Neurological: She is alert and oriented to person, place, and time. Gait normal.  Skin: Skin is warm and dry.  Psychiatric: Mood, memory, affect and judgment normal.  Vitals reviewed.   Assessment and Plan :  Mild persistent asthmatic bronchitis with acute exacerbation - Plan: beclomethasone (QVAR) 40 MCG/ACT inhaler  Wheezing - Plan: albuterol (PROVENTIL HFA;VENTOLIN HFA) 108 (90 Base) MCG/ACT inhaler  Acute seasonal allergic rhinitis, unspecified trigger - Plan: fluticasone (FLONASE) 50 MCG/ACT nasal spray  Continue current treatment regimen - she will use Flonase when she develops a cold or having allergy symptoms - she will restart her Qvar at 1 puff bid until she is controlled at least 2 weeks and then she can  decrease down to qd dosing.  She has her albuterol if she needs it.    She plans on getting her health maintenance up - she plans to have her mammogram next and then her colonoscopy - it has been years since she has had a pap smear but plans to do that as well.  Windell Hummingbird PA-C  [facility] 10/02/2016 5:24 PM

## 2016-10-02 NOTE — Patient Instructions (Addendum)
Silver sneakers - to help with gym memberships    IF you received an x-ray today, you will receive an invoice from [facility]. Please contact [facility] at [phone] with questions or concerns regarding your invoice.   IF you received labwork today, you will receive an invoice from [facility]. Please contact [facility] at [phone] with questions or concerns regarding your invoice.   Our billing staff will not be able to assist you with questions regarding bills from these companies.  You will be contacted with the lab results as soon as they are available. The fastest way to get your results is to activate your My Chart account. Instructions are located on the last page of this paperwork. If you have not heard from [place] regarding the results in 2 weeks, please contact this office.

## 2016-12-18 ENCOUNTER — Other Ambulatory Visit: Payer: Self-pay | Admitting: *Deleted

## 2016-12-18 DIAGNOSIS — R062 Wheezing: Secondary | ICD-10-CM

## 2016-12-18 MED ORDER — BECLOMETHASONE DIPROPIONATE 40 MCG/ACT IN AERS: 1.0000 | INHALATION_SPRAY | Freq: Two times a day (BID) | RESPIRATORY_TRACT | 12 refills | Status: DC

## 2017-02-05 ENCOUNTER — Encounter: Payer: Self-pay | Admitting: Physician Assistant

## 2017-02-05 ENCOUNTER — Ambulatory Visit (INDEPENDENT_AMBULATORY_CARE_PROVIDER_SITE_OTHER): Payer: BC Managed Care – PPO | Admitting: Physician Assistant

## 2017-02-05 VITALS — BP 113/70 | HR 73 | Temp 98.0°F | Resp 16 | Ht 63.5 in | Wt 199.0 lb

## 2017-02-05 DIAGNOSIS — Z01419 Encounter for gynecological examination (general) (routine) without abnormal findings: Secondary | ICD-10-CM

## 2017-02-05 DIAGNOSIS — Z13228 Encounter for screening for other metabolic disorders: Secondary | ICD-10-CM | POA: Diagnosis not present

## 2017-02-05 DIAGNOSIS — Z780 Asymptomatic menopausal state: Secondary | ICD-10-CM

## 2017-02-05 DIAGNOSIS — Z1159 Encounter for screening for other viral diseases: Secondary | ICD-10-CM

## 2017-02-05 DIAGNOSIS — Z1321 Encounter for screening for nutritional disorder: Secondary | ICD-10-CM | POA: Diagnosis not present

## 2017-02-05 DIAGNOSIS — Z0000 Encounter for general adult medical examination without abnormal findings: Secondary | ICD-10-CM

## 2017-02-05 DIAGNOSIS — Z13220 Encounter for screening for lipoid disorders: Secondary | ICD-10-CM

## 2017-02-05 DIAGNOSIS — K5730 Diverticulosis of large intestine without perforation or abscess without bleeding: Secondary | ICD-10-CM | POA: Diagnosis not present

## 2017-02-05 DIAGNOSIS — J4520 Mild intermittent asthma, uncomplicated: Secondary | ICD-10-CM

## 2017-02-05 DIAGNOSIS — Z1329 Encounter for screening for other suspected endocrine disorder: Secondary | ICD-10-CM

## 2017-02-05 DIAGNOSIS — R102 Pelvic and perineal pain: Secondary | ICD-10-CM

## 2017-02-05 NOTE — Assessment & Plan Note (Signed)
Well controlled on Qvar - rarely uses her Ventolin

## 2017-02-05 NOTE — Progress Notes (Signed)
Jody [name]  MRN: [id number] DOB: [birth year]  PCP: [name]  Subjective:  Pt presents to clinic for a CPE.  She is doing well - she has fallen behind in her medical care with taking care of her parents in the last year or so.  She has really been working on her weight loss.  Over the last several weeks she has noticed that her lower left abdomin seems swollen and she has also had loose stools and discomfort.  She has known diverticula from her colonoscopy.  Last dental exam: every 6 months Last vision exam: in the fall Last pap: years ago - thinks about 2005 Last mammo: today - 3D - [facility] Last colonoscopy: 2012 - 5 year plan - needs it again - diagnosed diverticula at that time - got a call from Dr Collene Mares last year but was busy with her parents - she plans to call them to get that done soon Vaccinations -  Immunization History  Administered Date(s) Administered  . Influenza-Unspecified 08/02/2015, 07/02/2016  . Tdap 10/15/2010   Typical meals for patient: doing more portion control and being mindful of what she is eating Typical beverage choices: Exercises: walks at the mall - making choices that make her more active Sleeps: sleeping well 8 hrs per night on average  Patient Active Problem List   Diagnosis Date Noted  . Diverticula of colon 02/05/2017  . Extrinsic asthma 01/13/2014  . Reactive airway disease 07/11/2013  . Varicose veins of legs 07/11/2013  . Seasonal allergies 12/09/2012   Review of Systems  Constitutional: Negative.  Negative for chills and fever.  HENT: Negative.   Eyes: Negative.   Respiratory: Negative.  Negative for shortness of breath and wheezing.   Cardiovascular: Negative.   Gastrointestinal: Positive for abdominal pain (left pelvic area) and diarrhea (very soft recently). Negative for blood in stool.       Mild heartburn - thinks related to eating later at night or other reasons that she can identify  Endocrine: Negative.     Genitourinary: Negative.  Negative for dysuria, frequency, menstrual problem and vaginal discharge.       Rare hot flashes - no menses since 2011   Musculoskeletal: Negative.   Skin: Negative.   Allergic/Immunologic: Negative.   Neurological: Negative.   Hematological: Negative.   Psychiatric/Behavioral: Negative.      Current Outpatient Prescriptions on File Prior to Visit  Medication Sig Dispense Refill  . albuterol (PROVENTIL HFA;VENTOLIN HFA) 108 (90 Base) MCG/ACT inhaler Inhale 2 puffs into the lungs every 6 (six) hours as needed for wheezing. 1 Inhaler 0  . Ascorbic Acid (VITAMIN C PO) Take by mouth daily.    . beclomethasone (QVAR) 40 MCG/ACT inhaler Inhale 1 puff into the lungs 2 (two) times daily. 1 Inhaler 12  . calcium citrate-vitamin D (CITRACAL+D) 315-200 MG-UNIT per tablet Take 1 tablet by mouth 2 (two) times daily.    . Cholecalciferol (VITAMIN D3) 2000 units TABS Take by mouth.    . Methylcellulose, Laxative, (CITRUCEL PO) Take by mouth daily.    . Multiple Vitamins-Minerals (CENTRUM SILVER ULTRA WOMENS PO) Take 1 tablet by mouth daily.    . Probiotic Product (PROBIOTIC DAILY PO) Take by mouth.    . Probiotic Product (PROBIOTIC PO) Take by mouth. Ultra-flora balance    . sodium chloride (OCEAN) 0.65 % nasal spray Place 1 spray into the nose as needed for congestion.    . Vitamin Mixture (ESTER-C PO) Take 1 tablet by mouth daily.    Marland Kitchen  fluticasone (FLONASE) 50 MCG/ACT nasal spray Place 1-2 sprays into both nostrils daily. (Patient not taking: Reported on 02/05/2017) 16 g 1   No current facility-administered medications on file prior to visit.     Allergies  Allergen Reactions  . Aspirin     Bleeding  . Penicillins Hives  . Sulfa Antibiotics Hives    Social History   Social History  . Marital status: Single    Spouse name: N/A  . Number of children: N/A  . Years of education: N/A   Occupational History  . retired    Social History Main Topics  . Smoking  status: Never Smoker  . Smokeless tobacco: Never Used  . Alcohol use No     Comment: once or twice a year  . Drug use: No  . Sexual activity: No   Other Topics Concern  . None   Social History Narrative   Retired Education officer, [employer] (July 2016)   Single - lives in [place] with mother whom she is the sole caregiver - still has a house in [place]   No guns in home    Past Surgical History:  Procedure Laterality Date  . DILATION AND CURETTAGE OF UTERUS    . TONSILLECTOMY     61 yrs old    Family History  Problem Relation Age of Onset  . Diabetes Mother   . Hypertension Mother   . Diabetes Father   . Hypertension Father      Objective:  BP 113/70   Pulse 73   Temp 98 F (36.7 C) (Oral)   Resp 16   Ht 5' 3.5" (1.613 m)   Wt 199 lb (90.3 kg)   SpO2 98%   BMI 34.70 kg/m   Physical Exam  Constitutional: She is oriented to person, place, and time and well-developed, well-nourished, and in no distress.  HENT:  Head: Normocephalic and atraumatic.  Right Ear: Hearing, tympanic membrane, external ear and ear canal normal.  Left Ear: Hearing, tympanic membrane, external ear and ear canal normal.  Nose: Nose normal.  Mouth/Throat: Uvula is midline, oropharynx is clear and moist and mucous membranes are normal.  Eyes: Conjunctivae and EOM are normal. Pupils are equal, round, and reactive to light.  Neck: Trachea normal and normal range of motion. Neck supple. No thyroid mass and no thyromegaly present.  Cardiovascular: Normal rate, regular rhythm and normal heart sounds.   No murmur heard. Pulmonary/Chest: Effort normal and breath sounds normal. She has no wheezes.  Abdominal: Soft. Bowel sounds are normal. There is no tenderness.    Genitourinary: Vagina normal, uterus normal, cervix normal, right adnexa normal, left adnexa normal and vulva normal.  Genitourinary Comments: Difficult exam due to introitus opening size  Musculoskeletal: Normal range of  motion.  Lymphadenopathy:    She has no cervical adenopathy.  Neurological: She is alert and oriented to person, place, and time. She has normal motor skills, normal sensation, normal strength and normal reflexes. Gait normal.  Skin: Skin is warm and dry.  Psychiatric: Mood, memory, affect and judgment normal.    Visual Acuity Screening   Right eye Left eye Both eyes  Without correction:     With correction: _0   Wt Readings from Last 3 Encounters:  02/05/17 199 lb (90.3 kg)  10/02/16 209 lb 4.8 oz (94.9 kg)  04/20/15 183 lb 6.4 oz (83.2 kg)    Assessment and Plan :  Annual physical exam -  Plan: Care order/instruction:  Diverticula of colon - Plan: CBC with Differential/Platelet  Mild intermittent extrinsic asthma without complication - continue her qvar - she has not used her albuterol --   Screening for metabolic disorder - Plan: CMP14+EGFR  Screening cholesterol level - Plan: Lipid panel  Screening for thyroid disorder - Plan: TSH  Encounter for vitamin deficiency screening - Plan: VITAMIN D 25 Hydroxy (Vit-D Deficiency, Fractures)  Encounter for gynecological examination without abnormal finding - Plan: Pap IG and HPV (high risk) DNA detection - this will likely be her last pap smear  Female pelvic pain - Plan: CBC with Differential/Platelet - more of left lower quadrant pain - check CBC - pt has had change in her bowel habits - she will contact Dr Collene Mares to set up her next colonoscopy - wait for CBC if elevated will send for a CT - if normal we will recheck in 2-3 weeks and if she still has pain we will likely do a scan unless she has a colonoscopy that finds out the cause of her pain.  Postmenopausal - Plan: VITAMIN D 25 Hydroxy (Vit-D Deficiency, Fractures)  Encounter for hepatitis C screening test for low risk patient - Plan: HCV Antibody RFX to Quant PCR  Windell Hummingbird PA-C  [facility] 02/05/2017 4:21 PM

## 2017-02-05 NOTE — Patient Instructions (Addendum)
In order for your supplemental calcium to be effective.  Please take calcium either on an empty stomach or with food that does not contain calcium.  Your body is able only to absorb 580m of Calcium at a time from any one source.  Do not take with a MVI with iron because iron does not allow th calcium to be absorbed.  Please take Calcium citrate 5046m2-3x/day.  This supplement should have Vit D in it and if your pills do not please add addition Vit D 400 IU with each dose.  Probiotics that are recommended [employer] [employer] will contact you with your lab results as soon as they are available.   If you have not heard from me in 2 weeks, please contact me.  The fastest way to get your results is to register for My Chart (see the instructions on the last page of this printout).  IF you received an x-ray today, you will receive an [facility] from [facility]. Please contact [facility] at [phone] questions or concerns regarding your invoice.   IF you received labwork today, you will receive an invoice from [facility] at [phone] questions or concerns regarding your invoice.   Our billing staff will not be able to assist you with questions regarding bills from these companies.  You will be contacted with the lab results as soon as they are available. The fastest way to get your results is to activate your My Chart account. Instructions are located on the last page of this paperwork. If you have not heard from [place] the results in 2 weeks, please contact this office.    Health Maintenance, Female Adopting a healthy lifestyle and getting preventive care can go a long way to promote health and wellness. Talk with your health care provider about what schedule of regular examinations is right for you. This is a good chance for you to check in with your provider about disease prevention and staying healthy. In between checkups, there are  plenty of things you can do on your own. Experts have done a lot of research about which lifestyle changes and preventive measures are most likely to keep you healthy. Ask your health care provider for more information. Weight and diet Eat a healthy diet  Be sure to include plenty of vegetables, fruits, low-fat dairy products, and lean protein.  Do not eat a lot of foods high in solid fats, added sugars, or salt.  Get regular exercise. This is one of the most important things you can do for your health.  Most adults should exercise for at least 150 minutes each week. The exercise should increase your heart rate and make you sweat (moderate-intensity exercise).  Most adults should also do strengthening exercises at least twice a week. This is in addition to the moderate-intensity exercise. Maintain a healthy weight  Body mass index (BMI) is a measurement that can be used to identify possible weight problems. It estimates body fat based on height and weight. Your health care provider can help determine your BMI and help you achieve or maintain a healthy weight.  For females 2049ears of age and older:  A BMI below 18.5 is considered underweight.  A BMI of 18.5 to 24.9 is normal.  A BMI of 25 to 29.9 is considered overweight.  A BMI of 30 and above is considered obese. Watch levels of cholesterol and blood lipids  You should start having your blood tested for  lipids and cholesterol at 62 years of age, then have this test every 5 years.  You may need to have your cholesterol levels checked more often if:  Your lipid or cholesterol levels are high.  You are older than 62 years of age.  You are at high risk for heart disease. Cancer screening Lung Cancer  Lung cancer screening is recommended for adults 65-98 years old who are at high risk for lung cancer because of a history of smoking.  A yearly low-dose CT scan of the lungs is recommended for people who:  Currently  smoke.  Have quit within the past 15 years.  Have at least a 30-pack-year history of smoking. A pack year is smoking an average of one pack of cigarettes a day for 1 year.  Yearly screening should continue until it has been 15 years since you quit.  Yearly screening should stop if you develop a health problem that would prevent you from having lung cancer treatment. Breast Cancer  Practice breast self-awareness. This means understanding how your breasts normally appear and feel.  It also means doing regular breast self-exams. Let your health care provider know about any changes, no matter how small.  If you are in your 20s or 30s, you should have a clinical breast exam (CBE) by a health care provider every 1-3 years as part of a regular health exam.  If you are 45 or older, have a CBE every year. Also consider having a breast X-ray (mammogram) every year.  If you have a family history of breast cancer, talk to your health care provider about genetic screening.  If you are at high risk for breast cancer, talk to your health care provider about having an MRI and a mammogram every year.  Breast cancer gene (BRCA) assessment is recommended for women who have family members with BRCA-related cancers. BRCA-related cancers include:  Breast.  Ovarian.  Tubal.  Peritoneal cancers.  Results of the assessment will determine the need for genetic counseling and BRCA1 and BRCA2 testing. Cervical Cancer  Your health care provider may recommend that you be screened regularly for cancer of the pelvic organs (ovaries, uterus, and vagina). This screening involves a pelvic examination, including checking for microscopic changes to the surface of your cervix (Pap test). You may be encouraged to have this screening done every 3 years, beginning at age 72.  For women ages 52-65, health care providers may recommend pelvic exams and Pap testing every 3 years, or they may recommend the Pap and pelvic  exam, combined with testing for human papilloma virus (HPV), every 5 years. Some types of HPV increase your risk of cervical cancer. Testing for HPV may also be done on women of any age with unclear Pap test results.  Other health care providers may not recommend any screening for nonpregnant women who are considered low risk for pelvic cancer and who do not have symptoms. Ask your health care provider if a screening pelvic exam is right for you.  If you have had past treatment for cervical cancer or a condition that could lead to cancer, you need Pap tests and screening for cancer for at least 20 years after your treatment. If Pap tests have been discontinued, your risk factors (such as having a new sexual partner) need to be reassessed to determine if screening should resume. Some women have medical problems that increase the chance of getting cervical cancer. In these cases, your health care provider may recommend more frequent screening  and Pap tests. Colorectal Cancer  This type of cancer can be detected and often prevented.  Routine colorectal cancer screening usually begins at 62 years of age and continues through 62 years of age.  Your health care provider may recommend screening at an earlier age if you have risk factors for colon cancer.  Your health care provider may also recommend using home test kits to check for hidden blood in the stool.  A small camera at the end of a tube can be used to examine your colon directly (sigmoidoscopy or colonoscopy). This is done to check for the earliest forms of colorectal cancer.  Routine screening usually begins at age 50.  Direct examination of the colon should be repeated every 5-10 years through 62 years of age. However, you may need to be screened more often if early forms of precancerous polyps or small growths are found. Skin Cancer  Check your skin from head to toe regularly.  Tell your health care provider about any new moles or  changes in moles, especially if there is a change in a mole's shape or color.  Also tell your health care provider if you have a mole that is larger than the size of a pencil eraser.  Always use sunscreen. Apply sunscreen liberally and repeatedly throughout the day.  Protect yourself by wearing long sleeves, pants, a wide-brimmed hat, and sunglasses whenever you are outside. Heart disease, diabetes, and high blood pressure  High blood pressure causes heart disease and increases the risk of stroke. High blood pressure is more likely to develop in:  People who have blood pressure in the high end of the normal range (130-139/85-89 mm Hg).  People who are overweight or obese.  People who are African American.  If you are 77-17 years of age, have your blood pressure checked every 3-5 years. If you are 21 years of age or older, have your blood pressure checked every year. You should have your blood pressure measured twice-once when you are at a hospital or clinic, and once when you are not at a hospital or clinic. Record the average of the two measurements. To check your blood pressure when you are not at a hospital or clinic, you can use:  An automated blood pressure machine at a pharmacy.  A home blood pressure monitor.  If you are between 65 years and 16 years old, ask your health care provider if you should take aspirin to prevent strokes.  Have regular diabetes screenings. This involves taking a blood sample to check your fasting blood sugar level.  If you are at a normal weight and have a low risk for diabetes, have this test once every three years after 62 years of age.  If you are overweight and have a high risk for diabetes, consider being tested at a younger age or more often. Preventing infection Hepatitis B  If you have a higher risk for hepatitis B, you should be screened for this virus. You are considered at high risk for hepatitis B if:  You were born in a country where  hepatitis B is common. Ask your health care provider which countries are considered high risk.  Your parents were born in a high-risk country, and you have not been immunized against hepatitis B (hepatitis B vaccine).  You have HIV or AIDS.  You use needles to inject street drugs.  You live with someone who has hepatitis B.  You have had sex with someone who has hepatitis B.  You get hemodialysis treatment.  You take certain medicines for conditions, including cancer, organ transplantation, and autoimmune conditions. Hepatitis C  Blood testing is recommended for:  Everyone born from [birth year] through 1966.  Anyone with known risk factors for hepatitis C. Sexually transmitted infections (STIs)  You should be screened for sexually transmitted infections (STIs) including gonorrhea and chlamydia if:  You are sexually active and are younger than 62 years of age.  You are older than 62 years of age and your health care provider tells you that you are at risk for this type of infection.  Your sexual activity has changed since you were last screened and you are at an increased risk for chlamydia or gonorrhea. Ask your health care provider if you are at risk.  If you do not have HIV, but are at risk, it may be recommended that you take a prescription medicine daily to prevent HIV infection. This is called pre-exposure prophylaxis (PrEP). You are considered at risk if:  You are sexually active and do not regularly use condoms or know the HIV status of your partner(s).  You take drugs by injection.  You are sexually active with a partner who has HIV. Talk with your health care provider about whether you are at high risk of being infected with HIV. If you choose to begin PrEP, you should first be tested for HIV. You should then be tested every 3 months for as long as you are taking PrEP. Pregnancy  If you are premenopausal and you may become pregnant, ask your health care provider about  preconception counseling.  If you may become pregnant, take 400 to 800 micrograms (mcg) of folic acid every day.  If you want to prevent pregnancy, talk to your health care provider about birth control (contraception). Osteoporosis and menopause  Osteoporosis is a disease in which the bones lose minerals and strength with aging. This can result in serious bone fractures. Your risk for osteoporosis can be identified using a bone density scan.  If you are 67 years of age or older, or if you are at risk for osteoporosis and fractures, ask your health care provider if you should be screened.  Ask your health care provider whether you should take a calcium or vitamin D supplement to lower your risk for osteoporosis.  Menopause may have certain physical symptoms and risks.  Hormone replacement therapy may reduce some of these symptoms and risks. Talk to your health care provider about whether hormone replacement therapy is right for you. Follow these instructions at home:  Schedule regular health, dental, and eye exams.  Stay current with your immunizations.  Do not use any tobacco products including cigarettes, chewing tobacco, or electronic cigarettes.  If you are pregnant, do not drink alcohol.  If you are breastfeeding, limit how much and how often you drink alcohol.  Limit alcohol intake to no more than 1 drink per day for nonpregnant women. One drink equals 12 ounces of beer, 5 ounces of wine, or 1 ounces of hard liquor.  Do not use street drugs.  Do not share needles.  Ask your health care provider for help if you need support or information about quitting drugs.  Tell your health care provider if you often feel depressed.  Tell your health care provider if you have ever been abused or do not feel safe at home. This information is not intended to replace advice given to you by your health care provider. Make sure you discuss any questions  you have with your health care  provider. Document Released: 04/16/2011 Document Revised: 03/08/2016 Document Reviewed: 07/05/2015 Elsevier Interactive Patient Education  2017 [employer].  .

## 2017-02-07 LAB — CMP14+EGFR
A/G RATIO: 1.7 (ref 1.2–2.2)
ALBUMIN: 4.3 g/dL (ref 3.6–4.8)
ALT: 22 IU/L (ref 0–32)
AST: 21 IU/L (ref 0–40)
Alkaline Phosphatase: 81 IU/L (ref 39–117)
BILIRUBIN TOTAL: 0.4 mg/dL (ref 0.0–1.2)
BUN / CREAT RATIO: 15 (ref 12–28)
BUN: 12 mg/dL (ref 8–27)
CO2: 23 mmol/L (ref 18–29)
Calcium: 9.6 mg/dL (ref 8.7–10.3)
Chloride: 102 mmol/L (ref 96–106)
Creatinine, Ser: 0.79 mg/dL (ref 0.57–1.00)
GFR calc non Af Amer: 81 mL/min/{1.73_m2} (ref >59)
GFR, EST AFRICAN AMERICAN: 93 mL/min/{1.73_m2} (ref >59)
GLOBULIN, TOTAL: 2.5 g/dL (ref 1.5–4.5)
GLUCOSE: 79 mg/dL (ref 65–99)
POTASSIUM: 3.6 mmol/L (ref 3.5–5.2)
SODIUM: 141 mmol/L (ref 134–144)
TOTAL PROTEIN: 6.8 g/dL (ref 6.0–8.5)

## 2017-02-07 LAB — HCV AB W REFLEX TO QUANT PCR

## 2017-02-07 LAB — HCV INTERPRETATION

## 2017-02-07 LAB — CBC WITH DIFFERENTIAL/PLATELET
BASOS ABS: 0 10*3/uL (ref 0.0–0.2)
Basos: 1 %
EOS (ABSOLUTE): 0.1 10*3/uL (ref 0.0–0.4)
EOS: 3 %
HEMOGLOBIN: 13.5 g/dL (ref 11.1–15.9)
Hematocrit: 40.5 % (ref 34.0–46.6)
IMMATURE GRANS (ABS): 0 10*3/uL (ref 0.0–0.1)
Immature Granulocytes: 0 %
LYMPHS ABS: 1 10*3/uL (ref 0.7–3.1)
LYMPHS: 24 %
MCH: 29.2 pg (ref 26.6–33.0)
MCHC: 33.3 g/dL (ref 31.5–35.7)
MCV: 88 fL (ref 79–97)
MONOCYTES: 11 %
Monocytes Absolute: 0.5 10*3/uL (ref 0.1–0.9)
Neutrophils Absolute: 2.6 10*3/uL (ref 1.4–7.0)
Neutrophils: 61 %
Platelets: 252 10*3/uL (ref 150–379)
RBC: 4.62 x10E6/uL (ref 3.77–5.28)
RDW: 14.4 % (ref 12.3–15.4)
WBC: 4.2 10*3/uL (ref 3.4–10.8)

## 2017-02-07 LAB — PAP IG AND HPV HIGH-RISK
HPV, HIGH-RISK: NEGATIVE
PAP SMEAR COMMENT: 0

## 2017-02-07 LAB — LIPID PANEL
Chol/HDL Ratio: 3.1 ratio (ref 0.0–4.4)
Cholesterol, Total: 163 mg/dL (ref 100–199)
HDL: 53 mg/dL (ref >39)
LDL Calculated: 93 mg/dL (ref 0–99)
Triglycerides: 87 mg/dL (ref 0–149)
VLDL Cholesterol Cal: 17 mg/dL (ref 5–40)

## 2017-02-07 LAB — VITAMIN D 25 HYDROXY (VIT D DEFICIENCY, FRACTURES): VIT D 25 HYDROXY: 36.8 ng/mL (ref 30.0–100.0)

## 2017-02-07 LAB — TSH: TSH: 1.91 u[IU]/mL (ref 0.450–4.500)

## 2017-03-08 ENCOUNTER — Telehealth: Payer: Self-pay | Admitting: Physician Assistant

## 2017-03-08 NOTE — Telephone Encounter (Addendum)
PATIENT STATES SHE HAD HER ANNUAL PHYSICAL DONE IN April 2018 WITH [facility]. Jody [name] WANTED HER TO RETURN IN A MONTH TO SEE IF HER SIDE PAIN WAS DOING BETTER. SHE SAID SHE DOES FEEL BETTER AND THAT SHE PLANS TO SCHEDULE TO HAVE A COLONOSCOPY DONE SOMETIME IN THE SUMMER. SHE CANCELLED HER APPT. ON TUES. 03/12/17 WITH Jody [name] AND SHE WILL RESCHEDULE AFTER SHE HAS THE COLONOSCOPY DONE. THEN SHE WILL HAVE SOME QUESTIONS FOR Jody [name]. BEST PHONE IF Jody [name] HAS QUESTIONS ([phone]) [phone] (CELL) MBC

## 2017-03-12 ENCOUNTER — Ambulatory Visit: Payer: BC Managed Care – PPO | Admitting: Physician Assistant

## 2018-01-13 ENCOUNTER — Other Ambulatory Visit: Payer: Self-pay | Admitting: Physician Assistant

## 2018-02-06 ENCOUNTER — Encounter: Payer: Self-pay | Admitting: Physician Assistant

## 2018-06-19 ENCOUNTER — Ambulatory Visit (INDEPENDENT_AMBULATORY_CARE_PROVIDER_SITE_OTHER): Payer: BC Managed Care – PPO | Admitting: Physician Assistant

## 2018-06-19 ENCOUNTER — Encounter: Payer: Self-pay | Admitting: Physician Assistant

## 2018-06-19 ENCOUNTER — Other Ambulatory Visit: Payer: Self-pay

## 2018-06-19 VITALS — BP 123/68 | HR 67 | Temp 98.0°F | Resp 18 | Ht 64.0 in | Wt 203.0 lb

## 2018-06-19 DIAGNOSIS — Z130 Encounter for screening for diseases of the blood and blood-forming organs and certain disorders involving the immune mechanism: Secondary | ICD-10-CM

## 2018-06-19 DIAGNOSIS — Z0001 Encounter for general adult medical examination with abnormal findings: Secondary | ICD-10-CM

## 2018-06-19 DIAGNOSIS — Z1389 Encounter for screening for other disorder: Secondary | ICD-10-CM

## 2018-06-19 DIAGNOSIS — Z13228 Encounter for screening for other metabolic disorders: Secondary | ICD-10-CM

## 2018-06-19 DIAGNOSIS — J302 Other seasonal allergic rhinitis: Secondary | ICD-10-CM

## 2018-06-19 DIAGNOSIS — R062 Wheezing: Secondary | ICD-10-CM

## 2018-06-19 DIAGNOSIS — Z0000 Encounter for general adult medical examination without abnormal findings: Secondary | ICD-10-CM

## 2018-06-19 DIAGNOSIS — R1904 Left lower quadrant abdominal swelling, mass and lump: Secondary | ICD-10-CM

## 2018-06-19 MED ORDER — FLUTICASONE PROPIONATE 50 MCG/ACT NA SUSP: 1.0000 | Freq: Every day | NASAL | 1 refills | Status: DC

## 2018-06-19 MED ORDER — ALBUTEROL SULFATE HFA 108 (90 BASE) MCG/ACT IN AERS: 2.0000 | INHALATION_SPRAY | Freq: Four times a day (QID) | RESPIRATORY_TRACT | 0 refills | Status: DC | PRN

## 2018-06-19 MED ORDER — BECLOMETHASONE DIPROP HFA 40 MCG/ACT IN AERB: INHALATION_SPRAY | RESPIRATORY_TRACT | 11 refills | Status: DC

## 2018-06-19 NOTE — Patient Instructions (Addendum)
[place], [place], [place] [address] Phone: [phone]   If you have lab work done today you will be contacted with your lab results within the next 2 weeks.  If you have not heard from [place] then please contact us. The fastest way to get your results is to register for My Chart.   IF you received an x-ray today, you will receive an invoice from [facility]. Please contact [facility] at [phone] with questions or concerns regarding your invoice.   IF you received labwork today, you will receive an invoice from [facility]. Please contact [facility] at [phone] with questions or concerns regarding your invoice.   Our billing staff will not be able to assist you with questions regarding bills from these companies.  You will be contacted with the lab results as soon as they are available. The fastest way to get your results is to activate your My Chart account. Instructions are located on the last page of this paperwork. If you have not heard from [place] regarding the results in 2 weeks, please contact this office.    Health Maintenance, Female Adopting a healthy lifestyle and getting preventive care can go a long way to promote health and wellness. Talk with your health care provider about what schedule of regular examinations is right for you. This is a good chance for you to check in with your provider about disease prevention and staying healthy. In between checkups, there are plenty of things you can do on your own. Experts have done a lot of research about which lifestyle changes and preventive measures are most likely to keep you healthy. Ask your health care provider for more information. Weight and diet Eat a healthy diet  Be sure to include plenty of vegetables, fruits, low-fat dairy products, and lean protein.  Do not eat a lot of foods high in solid fats, added sugars, or salt.  Get regular exercise. This is one of the  most important things you can do for your health. ? Most adults should exercise for at least 150 minutes each week. The exercise should increase your heart rate and make you sweat (moderate-intensity exercise). ? Most adults should also do strengthening exercises at least twice a week. This is in addition to the moderate-intensity exercise.  Maintain a healthy weight  Body mass index (BMI) is a measurement that can be used to identify possible weight problems. It estimates body fat based on height and weight. Your health care provider can help determine your BMI and help you achieve or maintain a healthy weight.  For females 21 years of age and older: ? A BMI below 18.5 is considered underweight. ? A BMI of 18.5 to 24.9 is normal. ? A BMI of 25 to 29.9 is considered overweight. ? A BMI of 30 and above is considered obese.  Watch levels of cholesterol and blood lipids  You should start having your blood tested for lipids and cholesterol at 63 years of age, then have this test every 5 years.  You may need to have your cholesterol levels checked more often if: ? Your lipid or cholesterol levels are high. ? You are older than 63 years of age. ? You are at high risk for heart disease.  Cancer screening Lung Cancer  Lung cancer screening is recommended for adults 39-15 years old who are at high risk for lung cancer because of a history of smoking.  A yearly low-dose CT scan  of the lungs is recommended for people who: ? Currently smoke. ? Have quit within the past 15 years. ? Have at least a 30-pack-year history of smoking. A pack year is smoking an average of one pack of cigarettes a day for 1 year.  Yearly screening should continue until it has been 15 years since you quit.  Yearly screening should stop if you develop a health problem that would prevent you from having lung cancer treatment.  Breast Cancer  Practice breast self-awareness. This means understanding how your breasts  normally appear and feel.  It also means doing regular breast self-exams. Let your health care provider know about any changes, no matter how small.  If you are in your 20s or 30s, you should have a clinical breast exam (CBE) by a health care provider every 1-3 years as part of a regular health exam.  If you are 92 or older, have a CBE every year. Also consider having a breast X-ray (mammogram) every year.  If you have a family history of breast cancer, talk to your health care provider about genetic screening.  If you are at high risk for breast cancer, talk to your health care provider about having an MRI and a mammogram every year.  Breast cancer gene (BRCA) assessment is recommended for women who have family members with BRCA-related cancers. BRCA-related cancers include: ? Breast. ? Ovarian. ? Tubal. ? Peritoneal cancers.  Results of the assessment will determine the need for genetic counseling and BRCA1 and BRCA2 testing.  Cervical Cancer Your health care provider may recommend that you be screened regularly for cancer of the pelvic organs (ovaries, uterus, and vagina). This screening involves a pelvic examination, including checking for microscopic changes to the surface of your cervix (Pap test). You may be encouraged to have this screening done every 3 years, beginning at age 8.  For women ages 49-65, health care providers may recommend pelvic exams and Pap testing every 3 years, or they may recommend the Pap and pelvic exam, combined with testing for human papilloma virus (HPV), every 5 years. Some types of HPV increase your risk of cervical cancer. Testing for HPV may also be done on women of any age with unclear Pap test results.  Other health care providers may not recommend any screening for nonpregnant women who are considered low risk for pelvic cancer and who do not have symptoms. Ask your health care provider if a screening pelvic exam is right for you.  If you have had  past treatment for cervical cancer or a condition that could lead to cancer, you need Pap tests and screening for cancer for at least 20 years after your treatment. If Pap tests have been discontinued, your risk factors (such as having a new sexual partner) need to be reassessed to determine if screening should resume. Some women have medical problems that increase the chance of getting cervical cancer. In these cases, your health care provider may recommend more frequent screening and Pap tests.  Colorectal Cancer  This type of cancer can be detected and often prevented.  Routine colorectal cancer screening usually begins at 63 years of age and continues through 63 years of age.  Your health care provider may recommend screening at an earlier age if you have risk factors for colon cancer.  Your health care provider may also recommend using home test kits to check for hidden blood in the stool.  A small camera at the end of a tube can be  used to examine your colon directly (sigmoidoscopy or colonoscopy). This is done to check for the earliest forms of colorectal cancer.  Routine screening usually begins at age 61.  Direct examination of the colon should be repeated every 5-10 years through 63 years of age. However, you may need to be screened more often if early forms of precancerous polyps or small growths are found.  Skin Cancer  Check your skin from head to toe regularly.  Tell your health care provider about any new moles or changes in moles, especially if there is a change in a mole's shape or color.  Also tell your health care provider if you have a mole that is larger than the size of a pencil eraser.  Always use sunscreen. Apply sunscreen liberally and repeatedly throughout the day.  Protect yourself by wearing long sleeves, pants, a wide-brimmed hat, and sunglasses whenever you are outside.  Heart disease, diabetes, and high blood pressure  High blood pressure causes heart  disease and increases the risk of stroke. High blood pressure is more likely to develop in: ? People who have blood pressure in the high end of the normal range (130-139/85-89 mm Hg). ? People who are overweight or obese. ? People who are African American.  If you are 51-89 years of age, have your blood pressure checked every 3-5 years. If you are 6 years of age or older, have your blood pressure checked every year. You should have your blood pressure measured twice-once when you are at a hospital or clinic, and once when you are not at a hospital or clinic. Record the average of the two measurements. To check your blood pressure when you are not at a hospital or clinic, you can use: ? An automated blood pressure machine at a pharmacy. ? A home blood pressure monitor.  If you are between 50 years and 16 years old, ask your health care provider if you should take aspirin to prevent strokes.  Have regular diabetes screenings. This involves taking a blood sample to check your fasting blood sugar level. ? If you are at a normal weight and have a low risk for diabetes, have this test once every three years after 63 years of age. ? If you are overweight and have a high risk for diabetes, consider being tested at a younger age or more often. Preventing infection Hepatitis B  If you have a higher risk for hepatitis B, you should be screened for this virus. You are considered at high risk for hepatitis B if: ? You were born in a country where hepatitis B is common. Ask your health care provider which countries are considered high risk. ? Your parents were born in a high-risk country, and you have not been immunized against hepatitis B (hepatitis B vaccine). ? You have HIV or AIDS. ? You use needles to inject street drugs. ? You live with someone who has hepatitis B. ? You have had sex with someone who has hepatitis B. ? You get hemodialysis treatment. ? You take certain medicines for conditions,  including cancer, organ transplantation, and autoimmune conditions.  Hepatitis C  Blood testing is recommended for: ? Everyone born from [birth year] through 1966. ? Anyone with known risk factors for hepatitis C.  Sexually transmitted infections (STIs)  You should be screened for sexually transmitted infections (STIs) including gonorrhea and chlamydia if: ? You are sexually active and are younger than 63 years of age. ? You are older than 63 years of  age and your health care provider tells you that you are at risk for this type of infection. ? Your sexual activity has changed since you were last screened and you are at an increased risk for chlamydia or gonorrhea. Ask your health care provider if you are at risk.  If you do not have HIV, but are at risk, it may be recommended that you take a prescription medicine daily to prevent HIV infection. This is called pre-exposure prophylaxis (PrEP). You are considered at risk if: ? You are sexually active and do not regularly use condoms or know the HIV status of your partner(s). ? You take drugs by injection. ? You are sexually active with a partner who has HIV.  Talk with your health care provider about whether you are at high risk of being infected with HIV. If you choose to begin PrEP, you should first be tested for HIV. You should then be tested every 3 months for as long as you are taking PrEP. Pregnancy  If you are premenopausal and you may become pregnant, ask your health care provider about preconception counseling.  If you may become pregnant, take 400 to 800 micrograms (mcg) of folic acid every day.  If you want to prevent pregnancy, talk to your health care provider about birth control (contraception). Osteoporosis and menopause  Osteoporosis is a disease in which the bones lose minerals and strength with aging. This can result in serious bone fractures. Your risk for osteoporosis can be identified using a bone density scan.  If you are  30 years of age or older, or if you are at risk for osteoporosis and fractures, ask your health care provider if you should be screened.  Ask your health care provider whether you should take a calcium or vitamin D supplement to lower your risk for osteoporosis.  Menopause may have certain physical symptoms and risks.  Hormone replacement therapy may reduce some of these symptoms and risks. Talk to your health care provider about whether hormone replacement therapy is right for you. Follow these instructions at home:  Schedule regular health, dental, and eye exams.  Stay current with your immunizations.  Do not use any tobacco products including cigarettes, chewing tobacco, or electronic cigarettes.  If you are pregnant, do not drink alcohol.  If you are breastfeeding, limit how much and how often you drink alcohol.  Limit alcohol intake to no more than 1 drink per day for nonpregnant women. One drink equals 12 ounces of beer, 5 ounces of wine, or 1 ounces of hard liquor.  Do not use street drugs.  Do not share needles.  Ask your health care provider for help if you need support or information about quitting drugs.  Tell your health care provider if you often feel depressed.  Tell your health care provider if you have ever been abused or do not feel safe at home. This information is not intended to replace advice given to you by your health care provider. Make sure you discuss any questions you have with your health care provider. Document Released: 04/16/2011 Document Revised: 03/08/2016 Document Reviewed: 07/05/2015 Elsevier Interactive Patient Education  [employer].

## 2018-06-19 NOTE — Progress Notes (Signed)
Jody [name]  MRN: [id number] DOB: [birth year]  PCP: Mancel Bale, PA-C   Chief Complaint  Patient presents with  . Annual Exam    Subjective:  Pt presents to clinic for a CPE. Overall doing well.  Basically living with her mom in [place] though owns her house in [place].    Last dental exam: every 6 months Last vision exam: wears glasses - has appt this month Last pap: 2018 Last mammo: 4/19 Last colonoscopy: appt this afternoon Vaccinations - plans to get but not today  Swollen area on LLQ - she has had for years - it has become more prominent since she has lost weight - it does not hurt when she moves or pushes on the area.  Typical meals for patient: greek yogurt for breakfast, mid-afternoon dinner - that is homecooked with a lot of veggies and proteins other than meat -- yogurt popsicle at niht Typical beverage choices: water Exercises: [facility] the [facility] -  Sleeps: 5 hrs per night straight and typically sleeps good - she will mainly times nap other times  Patient Active Problem List   Diagnosis Date Noted  . Diverticula of colon 02/05/2017  . Extrinsic asthma - no use of inhaler at all - she uses the Qvar at night only 01/13/2014  . Reactive airway disease 07/11/2013  . Varicose veins of legs 07/11/2013  . Seasonal allergies 12/09/2012    Patient Care Team: Mittie Bodo as PCP - General (Physician Assistant) Juanita Craver, MD as Consulting Physician (Gastroenterology)  Review of Systems  Constitutional: Positive for activity change (conscious effort).  HENT: Negative.   Eyes: Negative.   Respiratory: Negative.   Cardiovascular: Negative.   Gastrointestinal: Negative.  Negative for abdominal pain, constipation and diarrhea.  Endocrine: Negative.   Genitourinary: Negative.   Musculoskeletal: Negative.   Skin: Negative.   Allergic/Immunologic: Positive for environmental allergies.  Neurological: Negative.   Hematological: Negative.     Psychiatric/Behavioral: Negative.      Current Outpatient Medications on File Prior to Visit  Medication Sig Dispense Refill  . Ascorbic Acid (VITAMIN C PO) Take by mouth daily.    . calcium citrate-vitamin D (CITRACAL+D) 315-200 MG-UNIT per tablet Take 1 tablet by mouth 2 (two) times daily.    . Cholecalciferol (VITAMIN D3) 2000 units TABS Take by mouth.    . Methylcellulose, Laxative, (CITRUCEL PO) Take by mouth daily.    . Multiple Vitamins-Minerals (CENTRUM SILVER ULTRA WOMENS PO) Take 1 tablet by mouth daily.    . Probiotic Product (PROBIOTIC PO) Take by mouth. Ultra-flora balance    . sodium chloride (OCEAN) 0.65 % nasal spray Place 1 spray into the nose as needed for congestion.    . Vitamin Mixture (ESTER-C PO) Take 1 tablet by mouth daily.    . Probiotic Product (PROBIOTIC DAILY PO) Take by mouth.     No current facility-administered medications on file prior to visit.     Allergies  Allergen Reactions  . Aspirin     Bleeding  . Penicillins Hives  . Sulfa Antibiotics Hives    Social History   Socioeconomic History  . Marital status: Single    Spouse name: Not on file  . Number of children: Not on file  . Years of education: Not on file  . Highest education level: Not on file  Occupational History  . Occupation: retired  Scientific laboratory technician  . Financial resource strain: Not on file  . Food insecurity:  Worry: Not on file    Inability: Not on file  . Transportation needs:    Medical: Not on file    Non-medical: Not on file  Tobacco Use  . Smoking status: Never Smoker  . Smokeless tobacco: Never Used  Substance and Sexual Activity  . Alcohol use: No    Alcohol/week: 0.0 standard drinks    Comment: once or twice a year  . Drug use: No  . Sexual activity: Never  Lifestyle  . Physical activity:    Days per week: Not on file    Minutes per session: Not on file  . Stress: Not on file  Relationships  . Social connections:    Talks on phone: Not on file     Gets together: Not on file    Attends religious service: Not on file    Active member of club or organization: Not on file    Attends meetings of clubs or organizations: Not on file    Relationship status: Not on file  Other Topics Concern  . 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 DILATION AND CURETTAGE OF UTERUS    . TONSILLECTOMY     62 yrs old    Family History  Problem Relation Age of Onset  . Diabetes Mother   . Hypertension Mother   . Diabetes Father   . Hypertension Father   . Leukemia Father      Objective:  BP 123/68   Pulse 67   Temp 98 F (36.7 C) (Oral)   Resp 18   Ht _0  (1.626 m)   Wt 203 lb (92.1 kg)   SpO2 98%   BMI 34.84 kg/m   Physical Exam  Constitutional: She is oriented to person, place, and time. She appears well-developed and well-nourished.  HENT:  Head: Normocephalic and atraumatic.  Right Ear: Hearing, tympanic membrane, external ear and ear canal normal.  Left Ear: Hearing, tympanic membrane, external ear and ear canal normal.  Nose: Nose normal.  Mouth/Throat: Uvula is midline, oropharynx is clear and moist and mucous membranes are normal.  Eyes: Pupils are equal, round, and reactive to light. Conjunctivae, EOM and lids are normal. Right eye exhibits no discharge. Left eye exhibits no discharge.  Neck: Trachea normal and normal range of motion. Neck supple. No thyroid mass and no thyromegaly present.  Cardiovascular: Normal rate, regular rhythm and normal heart sounds.  No murmur heard. Pulmonary/Chest: Effort normal and breath sounds normal. She has no wheezes. Right breast exhibits no inverted nipple, no mass, no nipple discharge, no skin change and no tenderness. Left breast exhibits no mass, no nipple  discharge, no skin change and no tenderness. No breast swelling. Breasts are symmetrical.  Abdominal: Soft. Normal appearance and bowel sounds are normal. There is no tenderness.    Musculoskeletal: Normal range of motion.  Lymphadenopathy:       Head (right side): No tonsillar, no preauricular, no posterior auricular and no occipital adenopathy present.       Head (left side): No tonsillar, no preauricular, no posterior auricular and no occipital adenopathy present.    She has no cervical adenopathy.       Right:  No supraclavicular adenopathy present.       Left: No supraclavicular adenopathy present.  Neurological: She is alert and oriented to person, place, and time. She has normal strength and normal reflexes.  Skin: Skin is warm, dry and intact.  Psychiatric: She has a normal mood and affect. Her speech is normal and behavior is normal. Judgment and thought content normal.  Vitals reviewed.   Wt Readings from Last 3 Encounters:  06/19/18 203 lb (92.1 kg)  02/05/17 199 lb (90.3 kg)  10/02/16 209 lb 4.8 oz (94.9 kg)     Visual Acuity Screening   Right eye Left eye Both eyes  Without correction:     With correction: _0    Assessment and Plan :  Annual physical exam - anticipatory guidance - encouraged more movement as she has planned and contined healthy lifestyle changes that she has started.  Wheezing - Plan: albuterol (PROVENTIL HFA;VENTOLIN HFA) 108 (90 Base) MCG/ACT inhaler, beclomethasone (QVAR REDIHALER) 40 MCG/ACT inhaler - well controlled - she currently uses her QVAR once daily and has not used her albuterol inhaler at all this past year - she can try to decrease to eevery other day and then from there as long as her inhaler use stays almost none.  If her inhaler use increases then she should increase her QVAR back to the previous dose where she was controlled.  She has good control of her allergies which is helping this as she is no longer in an old school  so I think that helps.  Acute seasonal allergic rhinitis - Plan: DISCONTINUED: fluticasone (FLONASE) 50 MCG/ACT nasal spray  Screening for metabolic disorder - Plan: CMP14+EGFR  Screening for deficiency anemia - Plan: CBC with Differential/Platelet  Screening for blood or protein in urine - Plan: Urinalysis, dipstick only  LLQ abdominal mass - Plan: US Pelvis Complete - unsure what this is but she has a colonoscopy schedule and that will look at intestinal things but because it has gotten bigger we will check pelvic US to make sure there is not a GYN cause of this swelling as she has had for a long time without any evaluation  Windell Hummingbird PA-C  [facility] 06/24/2018 4:50 AM  Please note: Portions of this report may have been transcribed using dragon voice recognition software. Every effort was made to ensure accuracy; however, inadvertent computerized transcription errors may be present.

## 2018-06-20 LAB — CBC WITH DIFFERENTIAL/PLATELET
BASOS ABS: 0 10*3/uL (ref 0.0–0.2)
Basos: 1 %
EOS (ABSOLUTE): 0.1 10*3/uL (ref 0.0–0.4)
Eos: 3 %
Hematocrit: 40.2 % (ref 34.0–46.6)
Hemoglobin: 13.7 g/dL (ref 11.1–15.9)
Immature Grans (Abs): 0 10*3/uL (ref 0.0–0.1)
Immature Granulocytes: 0 %
Lymphocytes Absolute: 1.2 10*3/uL (ref 0.7–3.1)
Lymphs: 27 %
MCH: 30.2 pg (ref 26.6–33.0)
MCHC: 34.1 g/dL (ref 31.5–35.7)
MCV: 89 fL (ref 79–97)
MONOS ABS: 0.3 10*3/uL (ref 0.1–0.9)
Monocytes: 8 %
NEUTROS ABS: 2.7 10*3/uL (ref 1.4–7.0)
Neutrophils: 61 %
Platelets: 234 10*3/uL (ref 150–450)
RBC: 4.53 x10E6/uL (ref 3.77–5.28)
RDW: 13.6 % (ref 12.3–15.4)
WBC: 4.4 10*3/uL (ref 3.4–10.8)

## 2018-06-20 LAB — CMP14+EGFR
A/G RATIO: 1.6 (ref 1.2–2.2)
ALBUMIN: 4.3 g/dL (ref 3.6–4.8)
ALT: 20 IU/L (ref 0–32)
AST: 17 IU/L (ref 0–40)
Alkaline Phosphatase: 82 IU/L (ref 39–117)
BILIRUBIN TOTAL: 0.5 mg/dL (ref 0.0–1.2)
BUN/Creatinine Ratio: 16 (ref 12–28)
BUN: 13 mg/dL (ref 8–27)
CHLORIDE: 106 mmol/L (ref 96–106)
CO2: 22 mmol/L (ref 20–29)
Calcium: 9.9 mg/dL (ref 8.7–10.3)
Creatinine, Ser: 0.8 mg/dL (ref 0.57–1.00)
GFR calc non Af Amer: 79 mL/min/{1.73_m2} (ref >59)
GFR, EST AFRICAN AMERICAN: 91 mL/min/{1.73_m2} (ref >59)
Globulin, Total: 2.7 g/dL (ref 1.5–4.5)
Glucose: 89 mg/dL (ref 65–99)
POTASSIUM: 4 mmol/L (ref 3.5–5.2)
Sodium: 145 mmol/L — ABNORMAL HIGH (ref 134–144)
TOTAL PROTEIN: 7 g/dL (ref 6.0–8.5)

## 2018-06-20 LAB — URINALYSIS, DIPSTICK ONLY
Bilirubin, UA: NEGATIVE
Glucose, UA: NEGATIVE
KETONES UA: NEGATIVE
Nitrite, UA: NEGATIVE
PH UA: 7 (ref 5.0–7.5)
PROTEIN UA: NEGATIVE
RBC UA: NEGATIVE
SPEC GRAV UA: 1.009 (ref 1.005–1.030)
Urobilinogen, Ur: 0.2 mg/dL (ref 0.2–1.0)

## 2018-06-24 ENCOUNTER — Encounter: Payer: Self-pay | Admitting: Physician Assistant

## 2018-07-07 ENCOUNTER — Telehealth: Payer: Self-pay | Admitting: Family Medicine

## 2018-07-07 ENCOUNTER — Telehealth: Payer: Self-pay | Admitting: Physician Assistant

## 2018-07-07 ENCOUNTER — Other Ambulatory Visit: Payer: Self-pay | Admitting: Physician Assistant

## 2018-07-07 DIAGNOSIS — R1904 Left lower quadrant abdominal swelling, mass and lump: Secondary | ICD-10-CM

## 2018-07-07 NOTE — Telephone Encounter (Signed)
PER [facility] THIS NEEDS A VERBAL COSIGN   PLEASE ADV

## 2018-07-07 NOTE — Telephone Encounter (Signed)
Copied from [place] [id number]. Topic: General - Other >> Jul 07, 2018  3:36 PM Leward Quan A wrote: Reason for CRM: Olin Hauser with [facility] called to clarify orders sent in from Dr Windell Hummingbird. Need to know if patient has a palpable mass and if it is above or below the navel and does the Ultrasound need to be internal or external. Asked for either a call back or orders faxed Ph# [phone]  Fax# [phone]

## 2018-07-09 NOTE — Telephone Encounter (Signed)
Verbal cosign fine with me, let me know if I need to provide more info or paper signature. Thanks.

## 2018-07-21 NOTE — Telephone Encounter (Signed)
Hello Dr. Carlota Raspberry per [facility] they need a whole new order for pt to have an US PELVIS LIMITED (TRANSABDOMINAL ONLY) that Jody [name] placed back on 9/5 because she is no longer at this practice. If you could place this order so pt can have this done that would be great! Thanks!

## 2018-07-22 NOTE — Telephone Encounter (Signed)
Order placed

## 2018-07-29 ENCOUNTER — Telehealth: Payer: Self-pay | Admitting: General Practice

## 2018-07-29 NOTE — Telephone Encounter (Signed)
Spoke with Jody [name] at [facility] and informed her that I have faxed over order for [place], she verbalized understanding.

## 2018-07-29 NOTE — Telephone Encounter (Signed)
Copied from [place] [id number]. Topic: Quick Communication - See Telephone Encounter >> Jul 29, 2018  9:05 AM Conception Chancy, NT wrote: CRM for notification. See Telephone encounter for: 07/29/18.  Ruben Gottron is calling from [facility] and states it looks as if the patient is needing an ultrasound but they only received discharge print out summary.   Fax# [phone] Cb# [phone]

## 2018-08-01 ENCOUNTER — Telehealth: Payer: Self-pay | Admitting: Physician Assistant

## 2018-08-01 NOTE — Telephone Encounter (Signed)
Copied from [place] [phone]. Topic: General - Other >> Aug 01, 2018  9:16 AM Keene Breath wrote: Reason for CRM: Baker Janus from [facility] call to add to a referral for patient.  Patient was referred for a pelvic ultrasound.  Baker Janus is requesting that the patient also have a trans vaginal as well.  This is usually standard.  Please advise and call back at [phone].

## 2018-08-03 NOTE — Telephone Encounter (Signed)
[facility] and gave verbal order for transvaginal to accompany regular pelvic ultrasound. - Standard protocol

## 2018-08-04 NOTE — Telephone Encounter (Signed)
Cina with [facility] called regarding an ultrasound that patient is scheduled for. She wanted to know if Dr wanted to check for a cyst and if so then orders need to be changed to Ectopic with Transvaginal. Request a call back at Ph# [phone] option 1 then option 5 request Cina  ? Seem as if patient had procedure done at [facility] on 08/01/18

## 2018-08-09 NOTE — Telephone Encounter (Signed)
Spoke with pt.  She did have U/S at [facility]. Her mom was in Hospice there (passed away February 03, 2023 10/20)-she wanted to be close.   Pt states the U/S report was to be faxed to Dr. Carlota Raspberry from [facility].   She is still scheduled for colonoscopy with Dr. Collene Mares this next week.   Following colonoscopy, pt will make appt with Dr. Carlota Raspberry as her PCP.   IC and canceled her U/S appt with [facility] for 11/1.  Pt was appreciative of call

## 2018-08-09 NOTE — Telephone Encounter (Signed)
Was this procedure done, or still needing orders? Thanks.

## 2018-08-15 ENCOUNTER — Other Ambulatory Visit: Payer: Self-pay

## 2018-08-22 ENCOUNTER — Ambulatory Visit: Payer: BC Managed Care – PPO | Admitting: Family Medicine

## 2018-08-22 ENCOUNTER — Encounter: Payer: Self-pay | Admitting: Family Medicine

## 2018-08-22 VITALS — BP 116/75 | HR 70 | Temp 97.8°F | Resp 16 | Ht 64.0 in | Wt 191.0 lb

## 2018-08-22 DIAGNOSIS — Z1329 Encounter for screening for other suspected endocrine disorder: Secondary | ICD-10-CM

## 2018-08-22 DIAGNOSIS — R1904 Left lower quadrant abdominal swelling, mass and lump: Secondary | ICD-10-CM | POA: Diagnosis not present

## 2018-08-22 DIAGNOSIS — Z809 Family history of malignant neoplasm, unspecified: Secondary | ICD-10-CM | POA: Diagnosis not present

## 2018-08-22 NOTE — Patient Instructions (Signed)
° ° ° °  If you have lab work done today you will be contacted with your lab results within the next 2 weeks.  If you have not heard from us then please contact us. The fastest way to get your results is to register for My Chart. ° ° °IF you received an x-ray today, you will receive an invoice from [facility]. Please contact [facility] at [phone] with questions or concerns regarding your invoice.  ° °IF you received labwork today, you will receive an invoice from [facility]. Please contact [facility] at [phone] with questions or concerns regarding your invoice.  ° °Our billing staff will not be able to assist you with questions regarding bills from these companies. ° °You will be contacted with the lab results as soon as they are available. The fastest way to get your results is to activate your My Chart account. Instructions are located on the last page of this paperwork. If you have not heard from us regarding the results in 2 weeks, please contact this office. °  ° ° ° °

## 2018-08-22 NOTE — Progress Notes (Signed)
11/8/20198:17 AM  Jody [name] 1956/09/17, 62 y.o. female [id number]  Chief Complaint  Patient presents with  . Results    pt had an ultrasound done and wants to know the results  . Cough    chest congestion, pt getting better, x 1wk    HPI:   Patient is a 62 y.o. female with past medical history significant for asthma who presents today for followup on ultrasound results  Patient's previous PCP was Windell Hummingbird She was seen in sept for CPE Discussed long standing LLQ non painful abd mass - [place] ordered She had it done at [facility] several weeks ago Ultrasound is normal Area of concern shows multiple bowel loops Will be having colonoscopy soon with Dr Collene Mares Per patient, Dr Collene Mares was asking about recent TSH  Patient is worried about this mass as her father died of leukemia in 02-01-16 And her mother recently died of duodenal cancer She denies any red flag signs Weight loss has been intentional  Fall Risk  08/22/2018 06/19/2018 02/05/2017 10/02/2016 01/13/2014  Falls in the past year? 0 Yes No Yes No  Number falls in past yr: - 1 - - -  Injury with Fall? - Yes - Yes -  Comment - - - bruised right knee -     Depression screen [facility] 2/9 02/05/2017 10/02/2016 04/20/2015  Decreased Interest 0 0 0  Down, Depressed, Hopeless 0 0 0  PHQ - 2 Score 0 0 0    Allergies  Allergen Reactions  . Aspirin     Bleeding  . Penicillins Hives  . Sulfa Antibiotics Hives    Prior to Admission medications   Medication Sig Start Date End Date Taking? Authorizing Provider  albuterol (PROVENTIL HFA;VENTOLIN HFA) 108 (90 Base) MCG/ACT inhaler Inhale 2 puffs into the lungs every 6 (six) hours as needed for wheezing. 06/19/18  Yes Weber, Damaris Hippo, PA-C  Ascorbic Acid (VITAMIN C PO) Take by mouth daily.   Yes [provider]  beclomethasone (QVAR REDIHALER) 40 MCG/ACT inhaler INHALE 1 PUFF INTO THE LUNGS 2 (TWO) TIMES DAILY. 06/19/18  Yes Weber, Sarah L, PA-C  calcium citrate-vitamin D  (CITRACAL+D) 315-200 MG-UNIT per tablet Take 1 tablet by mouth 2 (two) times daily.   Yes [provider]  Cholecalciferol (VITAMIN D3) 2000 units TABS Take by mouth.   Yes [provider]  Methylcellulose, Laxative, (CITRUCEL PO) Take by mouth daily.   Yes [provider]  Multiple Vitamins-Minerals (CENTRUM SILVER ULTRA WOMENS PO) Take 1 tablet by mouth daily.   Yes [provider]  Probiotic Product (PROBIOTIC DAILY PO) Take by mouth.   Yes [provider]  Probiotic Product (PROBIOTIC PO) Take by mouth. Ultra-flora balance   Yes [provider]  sodium chloride (OCEAN) 0.65 % nasal spray Place 1 spray into the nose as needed for congestion.   Yes [provider]  Vitamin Mixture (ESTER-C PO) Take 1 tablet by mouth daily.    [provider]    Past Medical History:  Diagnosis Date  . Allergy   . Asthma   . Cancer (HCC)    basil cell carcinoma  . GERD (gastroesophageal reflux disease)     Past Surgical History:  Procedure Laterality Date  . DILATION AND CURETTAGE OF UTERUS    . TONSILLECTOMY     62 yrs old    Social History   Tobacco Use  . Smoking status: Never Smoker  . Smokeless tobacco: Never Used  Substance  Use Topics  . Alcohol use: No    Alcohol/week: 0.0 standard drinks    Comment: once or twice a year    Family History  Problem Relation Age of Onset  . Diabetes Mother   . Hypertension Mother   . Diabetes Father   . Hypertension Father   . Leukemia Father     ROS Per hpi  OBJECTIVE:  Blood pressure 116/75, pulse 70, temperature 97.8 F (36.6 C), temperature source Oral, resp. rate 16, height 5\' 4"  (1.626 m), weight 191 lb (86.6 kg), SpO2 97 %. Body mass index is 32.79 kg/m.   Wt Readings from Last 3 Encounters:  08/22/18 191 lb (86.6 kg)  06/19/18 203 lb (92.1 kg)  02/05/17 199 lb (90.3 kg)    Physical Exam  Constitutional: She is oriented to person, place, and time. She  appears well-developed and well-nourished.  HENT:  Head: Normocephalic and atraumatic.  Mouth/Throat: Mucous membranes are normal.  Eyes: Pupils are equal, round, and reactive to light. Conjunctivae and EOM are normal. No scleral icterus.  Neck: Neck supple.  Pulmonary/Chest: Effort normal.  Neurological: She is alert and oriented to person, place, and time.  Skin: Skin is warm and dry.  Psychiatric: She has a normal mood and affect.  Nursing note and vitals reviewed.   ASSESSMENT and PLAN  1. LLQ abdominal mass 2. FHx: cancer Pending colonoscopy. Consider CT abd/pelvis.   3. Screening for thyroid disorder - TSH   Return for after colonscopy.    Rutherford Guys, MD Primary Care at [place], [place] [address] Ph.  [phone] Fax [phone]

## 2018-08-23 LAB — TSH: TSH: 2.14 u[IU]/mL (ref 0.450–4.500)

## 2018-09-22 LAB — HM COLONOSCOPY

## 2018-09-26 ENCOUNTER — Encounter: Payer: Self-pay | Admitting: Family Medicine

## 2018-09-26 ENCOUNTER — Telehealth: Payer: Self-pay | Admitting: Family Medicine

## 2018-09-26 NOTE — Progress Notes (Signed)
Transabdominal and endovaginal pelvic Ultrasound: Impression: # uterine fibroids identified. Large is 8 cm in greatest dimension

## 2018-09-26 NOTE — Telephone Encounter (Signed)
Copied from [place] [id number]. Topic: General - Other >> Sep 26, 2018 10:35 AM Yvette Rack wrote: Reason for CRM: pt calling wanting to speak with Dr Pamella Pert about her ultra sound in [place] and her colonoscopy  Dr Collene Mares she's worried about something on her left side so that they can discuss further things to do please call pt at [phone]

## 2018-09-26 NOTE — Telephone Encounter (Signed)
Please get me Dr Lorie Apley records, office notes and colonoscopy, biopsy reports. She is GI with [facility]. Please also find out from patient where is [place] she had an ultrasound done, also what type of ultrasound and request that report.  I need this information before I speak with patient Thanks!

## 2018-09-29 NOTE — Progress Notes (Signed)
Diverticulitis in colon

## 2018-10-20 NOTE — Telephone Encounter (Signed)
Done days ago will f/u

## 2018-10-29 ENCOUNTER — Ambulatory Visit: Payer: BC Managed Care – PPO | Admitting: Family Medicine

## 2018-10-29 ENCOUNTER — Encounter: Payer: Self-pay | Admitting: Family Medicine

## 2018-10-29 VITALS — BP 105/61 | HR 60 | Temp 97.5°F | Ht 64.0 in | Wt 193.8 lb

## 2018-10-29 DIAGNOSIS — K429 Umbilical hernia without obstruction or gangrene: Secondary | ICD-10-CM

## 2018-10-29 DIAGNOSIS — R1900 Intra-abdominal and pelvic swelling, mass and lump, unspecified site: Secondary | ICD-10-CM

## 2018-10-29 DIAGNOSIS — K439 Ventral hernia without obstruction or gangrene: Secondary | ICD-10-CM

## 2018-10-29 NOTE — Progress Notes (Signed)
1/15/20201:35 PM  Jody [name] [birth year], 63 y.o. female [id number]  Chief Complaint  Patient presents with  . Follow-up    colonoscopy and [place] were done all were normal. Did find some diverticuli. Has mass on the left side that says feels has gotten bigger. Says it very tender    HPI:   Patient is a 63 y.o. female with past medical history significant for asthma who presents today for abd mass  Previous PCP ordered pelvic US and colonoscopy which were normal  Mass has been present for about 3 years Mass actually along edge of left mid abdomen and flank It feels that is is getting bigger, but she is also intentionally lost weight, so not sure if that is why it is more noticeable It is starting to bother her, specially when she bends over    Fall Risk  10/29/2018 08/22/2018 06/19/2018 02/05/2017 10/02/2016  Falls in the past year? 0 0 Yes No Yes  Number falls in past yr: - - 1 - -  Injury with Fall? - - Yes - Yes  Comment - - - - bruised right knee     Depression screen [facility] 2/9 10/29/2018 02/05/2017 10/02/2016  Decreased Interest 0 0 0  Down, Depressed, Hopeless 0 0 0  PHQ - 2 Score 0 0 0    Allergies  Allergen Reactions  . Aspirin     Bleeding  . Penicillins Hives  . Sulfa Antibiotics Hives    Prior to Admission medications   Medication Sig Start Date End Date Taking? Authorizing Provider  albuterol (PROVENTIL HFA;VENTOLIN HFA) 108 (90 Base) MCG/ACT inhaler Inhale 2 puffs into the lungs every 6 (six) hours as needed for wheezing. 06/19/18  Yes Weber, Damaris Hippo, PA-C  Ascorbic Acid (VITAMIN C PO) Take by mouth daily.   Yes [provider]  beclomethasone (QVAR REDIHALER) 40 MCG/ACT inhaler INHALE 1 PUFF INTO THE LUNGS 2 (TWO) TIMES DAILY. 06/19/18  Yes Weber, Sarah L, PA-C  calcium citrate-vitamin D (CITRACAL+D) 315-200 MG-UNIT per tablet Take 1 tablet by mouth 2 (two) times daily.   Yes [provider]  Cholecalciferol (VITAMIN D3) 2000 units TABS  Take by mouth.   Yes [provider]  Methylcellulose, Laxative, (CITRUCEL PO) Take by mouth daily.   Yes [provider]  Multiple Vitamins-Minerals (CENTRUM SILVER ULTRA WOMENS PO) Take 1 tablet by mouth daily.   Yes [provider]  Probiotic Product (PROBIOTIC DAILY PO) Take by mouth.   Yes [provider]  sodium chloride (OCEAN) 0.65 % nasal spray Place 1 spray into the nose as needed for congestion.   Yes [provider]    Past Medical History:  Diagnosis Date  . Allergy   . Asthma   . Cancer (HCC)    basil cell carcinoma  . GERD (gastroesophageal reflux disease)     Past Surgical History:  Procedure Laterality Date  . DILATION AND CURETTAGE OF UTERUS    . TONSILLECTOMY     63 yrs old    Social History   Tobacco Use  . Smoking status: Never Smoker  . Smokeless tobacco: Never Used  Substance Use Topics  . Alcohol use: No    Alcohol/week: 0.0 standard drinks    Comment: once or twice a year    Family History  Problem Relation Age of Onset  . Diabetes Mother   . Hypertension Mother   . Diabetes Father   . Hypertension Father   . Leukemia Father  ROS Per hpi  OBJECTIVE:  Blood pressure 105/61, pulse 60, temperature (!) 97.5 F (36.4 C), temperature source Oral, height 5\' 4"  (1.626 m), weight 193 lb 12.8 oz (87.9 kg), SpO2 97 %. Body mass index is 33.27 kg/m.   Physical Exam Vitals signs and nursing note reviewed.  Constitutional:      Appearance: She is well-developed.  HENT:     Head: Normocephalic and atraumatic.  Eyes:     General: No scleral icterus.    Conjunctiva/sclera: Conjunctivae normal.     Pupils: Pupils are equal, round, and reactive to light.  Neck:     Musculoskeletal: Neck supple.  Pulmonary:     Effort: Pulmonary effort is normal.  Abdominal:    Skin:    General: Skin is warm and dry.  Neurological:     Mental Status: She is alert and oriented to person, place, and time.      ASSESSMENT and PLAN  1. Mass of soft tissue of abdomen - US Abdomen Limited; Future  Return for after 1 week after [place].    Rutherford Guys, MD Primary Care at [place], [place] [address] Ph.  [phone] Fax [phone]

## 2018-10-29 NOTE — Patient Instructions (Signed)
° ° ° °  If you have lab work done today you will be contacted with your lab results within the next 2 weeks.  If you have not heard from us then please contact us. The fastest way to get your results is to register for My Chart. ° ° °IF you received an x-ray today, you will receive an invoice from [facility]. Please contact [facility] at [phone] with questions or concerns regarding your invoice.  ° °IF you received labwork today, you will receive an invoice from [facility]. Please contact [facility] at [phone] with questions or concerns regarding your invoice.  ° °Our billing staff will not be able to assist you with questions regarding bills from these companies. ° °You will be contacted with the lab results as soon as they are available. The fastest way to get your results is to activate your My Chart account. Instructions are located on the last page of this paperwork. If you have not heard from us regarding the results in 2 weeks, please contact this office. °  ° ° ° °

## 2018-10-31 ENCOUNTER — Ambulatory Visit
Admission: RE | Admit: 2018-10-31 | Discharge: 2018-10-31 | Disposition: A | Discharge status: Home / Self Care | Payer: BC Managed Care – PPO | Source: Ambulatory Visit | Attending: Family Medicine | Admitting: Family Medicine

## 2018-10-31 ENCOUNTER — Telehealth: Payer: Self-pay | Admitting: Family Medicine

## 2018-10-31 DIAGNOSIS — R1900 Intra-abdominal and pelvic swelling, mass and lump, unspecified site: Secondary | ICD-10-CM

## 2018-10-31 NOTE — Progress Notes (Signed)
Diverticulitis in the entire examined colon No specimens collected

## 2018-10-31 NOTE — Telephone Encounter (Signed)
Copied from [place] [phone]. Topic: General - Other >> Oct 31, 2018  2:53 PM Yvette Rack wrote: Reason for CRM: Pt stated that she had the ultrasound done today 10/31/18 and she was advised that the results would be available on Monday 11/03/18. Pt requests a call back to discuss results. Pt also scheduled an appt on 12/08/18 at 3:20 pm. Cb# [phone]

## 2018-11-04 NOTE — Telephone Encounter (Signed)
I talked with pt.Pt states that she will come by the office tomorrow to get blood work done BMP. Pt states understanding of CT of the abd has been placed and that blood work will need ot be done before CT is done

## 2018-11-04 NOTE — Addendum Note (Signed)
Addended by: Rutherford Guys on: 11/04/2018 12:20 AM   Modules accepted: Orders

## 2018-11-05 ENCOUNTER — Ambulatory Visit (INDEPENDENT_AMBULATORY_CARE_PROVIDER_SITE_OTHER): Payer: BC Managed Care – PPO | Admitting: Emergency Medicine

## 2018-11-05 DIAGNOSIS — R1900 Intra-abdominal and pelvic swelling, mass and lump, unspecified site: Secondary | ICD-10-CM

## 2018-11-05 NOTE — Progress Notes (Signed)
Visit for labs only

## 2018-11-06 LAB — BASIC METABOLIC PANEL
BUN/Creatinine Ratio: 19 (ref 12–28)
BUN: 14 mg/dL (ref 8–27)
CO2: 27 mmol/L (ref 20–29)
Calcium: 10.1 mg/dL (ref 8.7–10.3)
Chloride: 103 mmol/L (ref 96–106)
Creatinine, Ser: 0.74 mg/dL (ref 0.57–1.00)
GFR calc Af Amer: 100 mL/min/{1.73_m2} (ref >59)
GFR calc non Af Amer: 86 mL/min/{1.73_m2} (ref >59)
Glucose: 89 mg/dL (ref 65–99)
Potassium: 4 mmol/L (ref 3.5–5.2)
Sodium: 143 mmol/L (ref 134–144)

## 2018-11-19 ENCOUNTER — Other Ambulatory Visit: Payer: BC Managed Care – PPO

## 2018-11-20 ENCOUNTER — Ambulatory Visit
Admission: RE | Admit: 2018-11-20 | Discharge: 2018-11-20 | Disposition: A | Discharge status: Home / Self Care | Payer: BC Managed Care – PPO | Source: Ambulatory Visit | Attending: Family Medicine | Admitting: Family Medicine

## 2018-11-20 DIAGNOSIS — R1900 Intra-abdominal and pelvic swelling, mass and lump, unspecified site: Secondary | ICD-10-CM

## 2018-11-20 MED ORDER — IOPAMIDOL (ISOVUE-300) INJECTION 61%
100.0000 mL | Freq: Once | INTRAVENOUS | Status: AC | PRN
  Administered 2018-11-20: 100 mL via INTRAVENOUS

## 2018-11-21 NOTE — Addendum Note (Signed)
Addended by: Rutherford Guys on: 11/21/2018 11:03 PM   Modules accepted: Orders

## 2018-12-08 ENCOUNTER — Ambulatory Visit: Payer: BC Managed Care – PPO | Admitting: Family Medicine

## 2018-12-08 ENCOUNTER — Encounter: Payer: Self-pay | Admitting: Family Medicine

## 2018-12-08 ENCOUNTER — Other Ambulatory Visit: Payer: Self-pay

## 2018-12-08 VITALS — BP 96/68 | HR 68 | Resp 20 | Ht 64.17 in | Wt 193.2 lb

## 2018-12-08 DIAGNOSIS — K439 Ventral hernia without obstruction or gangrene: Secondary | ICD-10-CM

## 2018-12-08 NOTE — Patient Instructions (Signed)
° ° ° °  If you have lab work done today you will be contacted with your lab results within the next 2 weeks.  If you have not heard from us then please contact us. The fastest way to get your results is to register for My Chart. ° ° °IF you received an x-ray today, you will receive an invoice from [facility]. Please contact [facility] at [phone] with questions or concerns regarding your invoice.  ° °IF you received labwork today, you will receive an invoice from [facility]. Please contact [facility] at [phone] with questions or concerns regarding your invoice.  ° °Our billing staff will not be able to assist you with questions regarding bills from these companies. ° °You will be contacted with the lab results as soon as they are available. The fastest way to get your results is to activate your My Chart account. Instructions are located on the last page of this paperwork. If you have not heard from us regarding the results in 2 weeks, please contact this office. °  ° ° ° °

## 2018-12-08 NOTE — Progress Notes (Signed)
2/24/20204:21 PM  Jody [name] 1956-07-03, 63 y.o. female [id number]  Chief Complaint  Patient presents with  . Discuss Ultrasound Results    HPI:   Patient is a 63 y.o. female with past medical history significant for asthma who presents today for followup on ct results   Sees gen surg in 2 days  Ct abd done feb 2020: IMPRESSION: 1. 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 No herniated loops of bowel or other complication. 2. Additional smaller fat containing paraumbilical hernia without associated inflammation. 3. Moderate retained stool throughout the colon, suggesting constipation. 4. Fibroid uterus.   Fall Risk  12/08/2018 10/29/2018 08/22/2018 06/19/2018 02/05/2017  Falls in the past year? 0 0 0 Yes No  Number falls in past yr: 0 - - 1 -  Injury with Fall? 0 - - Yes -  Comment - - - - -  Follow up Falls evaluation completed - - - -     Depression screen [facility] 2/9 10/29/2018 02/05/2017 10/02/2016  Decreased Interest 0 0 0  Down, Depressed, Hopeless 0 0 0  PHQ - 2 Score 0 0 0    Allergies  Allergen Reactions  . Aspirin     Bleeding  . Penicillins Hives  . Sulfa Antibiotics Hives    Prior to Admission medications   Medication Sig Start Date End Date Taking? Authorizing Provider  albuterol (PROVENTIL HFA;VENTOLIN HFA) 108 (90 Base) MCG/ACT inhaler Inhale 2 puffs into the lungs every 6 (six) hours as needed for wheezing. 06/19/18  Yes Weber, Damaris Hippo, PA-C  Ascorbic Acid (VITAMIN C PO) Take by mouth daily.   Yes [provider]  beclomethasone (QVAR REDIHALER) 40 MCG/ACT inhaler INHALE 1 PUFF INTO THE LUNGS 2 (TWO) TIMES DAILY. 06/19/18  Yes Weber, Sarah L, PA-C  calcium citrate-vitamin D (CITRACAL+D) 315-200 MG-UNIT per tablet Take 1 tablet by mouth 2 (two) times daily.   Yes [provider]  Cholecalciferol (VITAMIN D3) 2000 units  TABS Take by mouth.   Yes [provider]  Methylcellulose, Laxative, (CITRUCEL PO) Take by mouth daily.   Yes [provider]  Multiple Vitamins-Minerals (CENTRUM SILVER ULTRA WOMENS PO) Take 1 tablet by mouth daily.   Yes [provider]  Probiotic Product (PROBIOTIC DAILY PO) Take by mouth.   Yes [provider]  sodium chloride (OCEAN) 0.65 % nasal spray Place 1 spray into the nose as needed for congestion.   Yes [provider]    Past Medical History:  Diagnosis Date  . Allergy   . Asthma   . Cancer (HCC)    basil cell carcinoma  . GERD (gastroesophageal reflux disease)     Past Surgical History:  Procedure Laterality Date  . DILATION AND CURETTAGE OF UTERUS    . TONSILLECTOMY     63 yrs old    Social History   Tobacco Use  . Smoking status: Never Smoker  . Smokeless tobacco: Never Used  Substance Use Topics  . Alcohol use: No    Alcohol/week: 0.0 standard drinks    Comment: once or twice a year    Family History  Problem Relation Age of Onset  . Diabetes Mother   . Hypertension Mother   . Cancer Mother   . Diabetes Father   . Hypertension Father   . Leukemia Father     ROS Per hpi  OBJECTIVE:  Blood pressure 96/68, pulse  68, resp. rate 20, height 5' 4.17" (1.63 m), weight 193 lb 3.2 oz (87.6 kg), SpO2 97 %. Body mass index is 32.98 kg/m.   Physical Exam Vitals signs and nursing note reviewed.  Constitutional:      Appearance: She is well-developed.  HENT:     Head: Normocephalic and atraumatic.  Eyes:     General: No scleral icterus.    Conjunctiva/sclera: Conjunctivae normal.     Pupils: Pupils are equal, round, and reactive to light.  Neck:     Musculoskeletal: Neck supple.  Pulmonary:     Effort: Pulmonary effort is normal.  Skin:    General: Skin is warm and dry.  Neurological:     Mental Status: She is alert and oriented to person, place, and time.     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    Rutherford Guys, MD Primary Care at [place], [place] [address] Ph.  [phone] Fax [phone]

## 2018-12-10 ENCOUNTER — Ambulatory Visit: Payer: Self-pay | Admitting: General Surgery

## 2018-12-10 NOTE — H&P (Signed)
History of Present Illness Ralene Ok MD; 12/10/2018 2:55 PM) The patient is a 63 year old female who presents with an abdominal wall hernia. Referred by: Dr. Pamella Pert Chief Complaint: Hernia  Patient is a 63 year old female with a 4 year history of a left lower quadrant abdominal hernia. Patient also with recent umbilical hernia. Patient states that over the last 4 years she is to do some increased discomfort/pain to the left lower quadrant area. Patient recently underwent a CT scan which I reviewed personally which revealed a large left lower quadrant spigelian hernia. Patient states that she feels her last one to 2 years she's increased her weight. She states this may have increased in size during this time as well. Patient transferred active, walks, sHe then goes to the gym.  Patient states that her umbilical pain has been on and off. She's had no previous abdominal surgery.    Past Surgical History Sabino Gasser, [place]; 12/10/2018 2:29 PM) Tonsillectomy   Diagnostic Studies History Sabino Gasser, CMA; 12/10/2018 2:29 PM) Colonoscopy  within last year Mammogram  within last year Pap Smear  1-5 years ago  Allergies Sabino Gasser, [place]; 12/10/2018 2:30 PM) Penicillins  Sulfa Drugs  Acetaminophen   Medication History Sabino Gasser, CMA; 12/10/2018 2:32 PM) Qvar RediHaler (40MCG/ACT Aero Breath Act, Inhalation) Active. Probiotic (Oral) Active. Calcium Citrate (200MG  Tablet, Oral) Active. Medications Reconciled  Social History Sabino Gasser, CMA; 12/10/2018 2:29 PM) Alcohol use  Occasional alcohol use. Caffeine use  Coffee, Tea. No drug use  Tobacco use  Never smoker.  Family History Sabino Gasser, [place]; 12/10/2018 2:29 PM) Arthritis  Mother. Breast Cancer  Mother. Cancer  Father, Mother. Colon Polyps  Father. Diabetes Mellitus  Father, Mother. Hypertension  Mother.  Pregnancy / Birth History Sabino Gasser, [place]; 12/10/2018 2:29 PM) Age at  menarche  36 years. Age of menopause  90-55 Gravida  0 Para  0  Other Problems Sabino Gasser, CMA; 12/10/2018 2:29 PM) Asthma  Cancer  Gastroesophageal Reflux Disease     Review of Systems Ralene Ok MD; 12/10/2018 2:53 PM) General Not Present- Appetite Loss, Chills, Fatigue, Fever, Night Sweats, Weight Gain and Weight Loss. Skin Not Present- Change in Wart/Mole, Dryness, Hives, Jaundice, New Lesions, Non-Healing Wounds, Rash and Ulcer. HEENT Present- Wears glasses/contact lenses. Not Present- Earache, Hearing Loss, Hoarseness, Nose Bleed, Oral Ulcers, Ringing in the Ears, Seasonal Allergies, Sinus Pain, Sore Throat, Visual Disturbances and Yellow Eyes. Respiratory Not Present- Bloody sputum, Chronic Cough, Difficulty Breathing, Snoring and Wheezing. Breast Not Present- Breast Mass, Breast Pain, Nipple Discharge and Skin Changes. Cardiovascular Not Present- Chest Pain, Difficulty Breathing Lying Down, Leg Cramps, Palpitations, Rapid Heart Rate, Shortness of Breath and Swelling of Extremities. Gastrointestinal Present- Constipation. Not Present- Abdominal Pain, Bloating, Bloody Stool, Change in Bowel Habits, Chronic diarrhea, Difficulty Swallowing, Excessive gas, Gets full quickly at meals, Hemorrhoids, Indigestion, Nausea, Rectal Pain and Vomiting. Female Genitourinary Not Present- Frequency, Nocturia, Painful Urination, Pelvic Pain and Urgency. Musculoskeletal Not Present- Back Pain, Joint Pain, Joint Stiffness, Muscle Pain, Muscle Weakness and Swelling of Extremities. Neurological Not Present- Decreased Memory, Fainting, Headaches, Numbness, Seizures, Tingling, Tremor, Trouble walking and Weakness. Psychiatric Present- Depression. Not Present- Anxiety, Bipolar, Change in Sleep Pattern, Fearful and Frequent crying. Endocrine Not Present- Cold Intolerance, Excessive Hunger, Hair Changes, Heat Intolerance, Hot flashes and New Diabetes. Hematology Not Present- Blood Thinners, Easy  Bruising, Excessive bleeding, Gland problems, HIV and Persistent Infections. All other systems negative  Vitals Sabino Gasser CMA; 12/10/2018 2:31 PM) 12/10/2018 2:30 PM Weight:  194.5 lb Height: 63in Body Surface Area: 1.91 m Body Mass Index: 34.45 kg/m  Temp.: 97.65F(Oral)  Pulse: 69 (Regular)  BP: 102/64 (Sitting, Left Arm, Standard)       Physical Exam Ralene Ok MD; 12/10/2018 2:56 PM) The physical exam findings are as follows: Note:Constitutional: No acute distress, conversant, appears stated age  Eyes: Anicteric sclerae, moist conjunctiva, no lid lag  Neck: No thyromegaly, trachea midline, no cervical lymphadenopathy  Lungs: Clear to auscultation biilaterally, normal respiratory effot  Cardiovascular: regular rate & rhythm, no murmurs, no peripheal edema, pedal pulses 2+  GI: Soft, no masses or hepatosplenomegaly, non-tender to palpation  MSK: Normal gait, no clubbing cyanosis, edema  Skin: No rashes, palpation reveals normal skin turgor  Psychiatric: Appropriate judgment and insight, oriented to person, place, and time  Abdomen Inspection Hernias - Ventral - Reducible(Umbilical hernia, small, 1 cm, left lower quadrants healing hernia large, fat-containing). Incisional hernia - Reducible. Umbilical hernia - Reducible.    Assessment & Plan Ralene Ok MD; 12/10/2018 2:57 PM) VENTRAL HERNIA WITHOUT OBSTRUCTION OR GANGRENE (K43.9) Impression: Patient is a 63 year old female with an umbilical hernia and left lower quadrant spigelian hernia. 1. The patient will like to proceed to the operating room for robotic umbilical and left spigelian hernia repair with mesh.  2. I discussed with the patient the signs and symptoms of incarceration and strangulation and the need to proceed to the ER should they occur.  3. I discussed with the patient the risks and benefits of the procedure to include but not limited to: Infection, bleeding, damage to  surrounding structures, possible need for further surgery, possible nerve pain, and possible recurrence. The patient was understanding and wishes to proceed.

## 2019-02-20 ENCOUNTER — Ambulatory Visit (HOSPITAL_COMMUNITY): Admission: RE | Admit: 2019-02-20 | Payer: BC Managed Care – PPO | Source: Home / Self Care | Admitting: General Surgery

## 2019-02-20 ENCOUNTER — Encounter (HOSPITAL_COMMUNITY): Admission: RE | Payer: Self-pay | Source: Home / Self Care

## 2019-02-20 SURGERY — REPAIR, HERNIA, VENTRAL, ROBOT-ASSISTED
Anesthesia: General

## 2019-04-28 LAB — HM MAMMOGRAPHY

## 2019-06-05 ENCOUNTER — Encounter (HOSPITAL_COMMUNITY): Payer: Self-pay

## 2019-06-05 NOTE — Patient Instructions (Addendum)
DUE TO COVID-19 ONLY ONE VISITOR IS ALLOWED TO COME WITH YOU AND STAY IN THE WAITING ROOM ONLY DURING PRE OP AND PROCEDURE. THE ONE VISITOR MAY VISIT WITH YOU IN YOUR PRIVATE ROOM DURING VISITING HOURS ONLY!!   COVID SWAB TESTING MUST BE COMPLETED ON:  Today immediately after pre op appointment.   [address], [place] -[facility] enter pre surgical testing line (Must self quarantine after testing. Follow instructions on handout.)             Your procedure is scheduled on: Friday, Jun 12, 2019   Report to [facility] Main  Entrance    Report to admitting at 1:00 PM   Call this number if you have problems the morning of surgery [phone]   Do not eat food :After Midnight.   May have liquids until 12  NOON day of surgery   CLEAR LIQUID DIET  Foods Allowed                                                                     Foods Excluded  Water, Black Coffee and tea, regular and decaf                             liquids that you cannot  Plain Jell-O in any flavor  (No red)                                           see through such as: Fruit ices (not with fruit pulp)                                     milk, soups, orange juice  Iced Popsicles (No red)                                    All solid food Carbonated beverages, regular and diet                                    Apple juices Sports drinks like Gatorade (No red) Lightly seasoned clear broth or consume(fat free) Sugar, honey syrup  Sample Menu Breakfast                                Lunch                                     Supper Cranberry juice                    Beef broth                            Chicken broth  Jell-O                                     Grape juice                           Apple juice Coffee or tea                        Jell-O                                      Popsicle                                                Coffee or tea                         Coffee or tea    DRINK ENSURE PRE SURGERY DRINK AT 12 NOON DAY OF SURGERY.   Brush your teeth the morning of surgery.   Do NOT smoke after Midnight   Take these medicines the morning of surgery with A SIP OF WATER: None   May use nasal spray day of surgery   Bring Inhaler day of surgery                               You may not have any metal on your body including hair pins, jewelry, and body piercings             Do not wear make-up, lotions, powders, perfumes/cologne, or deodorant             Do not wear nail polish.  Do not shave  48 hours prior to surgery.                Do not bring valuables to the hospital. [place].   Contacts, dentures or bridgework may not be worn into surgery.   Bring small overnight bag day of surgery.    Special Instructions: Bring a copy of your healthcare power of attorney and living will documents         the day of surgery if you haven't scanned them in before.              Please read over the following fact sheets you were given:  [facility] - Preparing for Surgery Before surgery, you can play an important role.  Because skin is not sterile, your skin needs to be as free of germs as possible.  You can reduce the number of germs on your skin by washing with CHG (chlorahexidine gluconate) soap before surgery.  CHG is an antiseptic cleaner which kills germs and bonds with the skin to continue killing germs even after washing. Please DO NOT use if you have an allergy to CHG or antibacterial soaps.  If your skin becomes reddened/irritated stop using the CHG and inform your nurse when you arrive at Short Stay. Do not shave (including legs  and underarms) for at least 48 hours prior to the first CHG shower.  You may shave your face/neck.  Please follow these instructions carefully:  1.  Shower with CHG Soap the night before surgery and the  morning of surgery.  2.  If you choose to wash your hair, wash  your hair first as usual with your normal  shampoo.  3.  After you shampoo, rinse your hair and body thoroughly to remove the shampoo.                             4.  Use CHG as you would any other liquid soap.  You can apply chg directly to the skin and wash.  Gently with a scrungie or clean washcloth.  5.  Apply the CHG Soap to your body ONLY FROM THE NECK DOWN.   Do   not use on face/ open                           Wound or open sores. Avoid contact with eyes, ears mouth and   genitals (private parts).                       Wash face,  Genitals (private parts) with your normal soap.             6.  Wash thoroughly, paying special attention to the area where your    surgery  will be performed.  7.  Thoroughly rinse your body with warm water from the neck down.  8.  DO NOT shower/wash with your normal soap after using and rinsing off the CHG Soap.                9.  Pat yourself dry with a clean towel.            10.  Wear clean pajamas.            11.  Place clean sheets on your bed the night of your first shower and do not  sleep with pets. Day of Surgery : Do not apply any lotions/deodorants the morning of surgery.  Please wear clean clothes to the hospital/surgery center.  FAILURE TO FOLLOW THESE INSTRUCTIONS MAY RESULT IN THE CANCELLATION OF YOUR SURGERY  PATIENT SIGNATURE_________________________________  NURSE SIGNATURE__________________________________  ________________________________________________________________________

## 2019-06-09 ENCOUNTER — Other Ambulatory Visit (HOSPITAL_COMMUNITY)
Admission: RE | Admit: 2019-06-09 | Discharge: 2019-06-09 | Disposition: A | Discharge status: Home / Self Care | Payer: BC Managed Care – PPO | Source: Ambulatory Visit | Attending: General Surgery | Admitting: General Surgery

## 2019-06-09 ENCOUNTER — Encounter (HOSPITAL_COMMUNITY)
Admission: RE | Admit: 2019-06-09 | Discharge: 2019-06-09 | Disposition: A | Discharge status: Home / Self Care | Payer: BC Managed Care – PPO | Source: Ambulatory Visit | Attending: General Surgery | Admitting: General Surgery

## 2019-06-09 ENCOUNTER — Other Ambulatory Visit: Payer: Self-pay

## 2019-06-09 ENCOUNTER — Encounter (HOSPITAL_COMMUNITY): Payer: Self-pay

## 2019-06-09 ENCOUNTER — Ambulatory Visit: Payer: Self-pay | Admitting: General Surgery

## 2019-06-09 DIAGNOSIS — K439 Ventral hernia without obstruction or gangrene: Secondary | ICD-10-CM | POA: Insufficient documentation

## 2019-06-09 DIAGNOSIS — Z20828 Contact with and (suspected) exposure to other viral communicable diseases: Secondary | ICD-10-CM | POA: Insufficient documentation

## 2019-06-09 DIAGNOSIS — Z01812 Encounter for preprocedural laboratory examination: Secondary | ICD-10-CM | POA: Insufficient documentation

## 2019-06-09 HISTORY — DX: Ventral hernia without obstruction or gangrene: K43.9

## 2019-06-09 HISTORY — DX: Diverticulosis of intestine, part unspecified, without perforation or abscess without bleeding: K57.90

## 2019-06-09 HISTORY — DX: Personal history of other diseases of the circulatory system: Z86.79

## 2019-06-09 HISTORY — DX: Umbilical hernia without obstruction or gangrene: K42.9

## 2019-06-09 HISTORY — DX: Depression, unspecified: F32.A

## 2019-06-09 HISTORY — DX: Polyp of corpus uteri: N84.0

## 2019-06-09 HISTORY — DX: Benign neoplasm of connective and other soft tissue, unspecified: D21.9

## 2019-06-09 HISTORY — DX: Personal history of diseases of the blood and blood-forming organs and certain disorders involving the immune mechanism: Z86.2

## 2019-06-09 LAB — CBC
HCT: 42.1 % (ref 36.0–46.0)
Hemoglobin: 13.6 g/dL (ref 12.0–15.0)
MCH: 30.9 pg (ref 26.0–34.0)
MCHC: 32.3 g/dL (ref 30.0–36.0)
MCV: 95.7 fL (ref 80.0–100.0)
Platelets: 220 10*3/uL (ref 150–400)
RBC: 4.4 MIL/uL (ref 3.87–5.11)
RDW: 12.7 % (ref 11.5–15.5)
WBC: 5.6 10*3/uL (ref 4.0–10.5)
nRBC: 0 % (ref 0.0–0.2)

## 2019-06-09 LAB — SARS CORONAVIRUS 2 (TAT 6-24 HRS): SARS Coronavirus 2: NEGATIVE

## 2019-06-11 NOTE — H&P (Signed)
History of Present Illness  The patient is a 63 year old female who presents with an abdominal wall hernia. Referred by: Dr. Pamella Pert Chief Complaint: Hernia  Patient is a 63 year old female with a 4 year history of a left lower quadrant abdominal hernia. Patient also with recent umbilical hernia. Patient states that over the last 4 years she is to do some increased discomfort/pain to the left lower quadrant area. Patient recently underwent a CT scan which I reviewed personally which revealed a large left lower quadrant spigelian hernia. Patient states that she feels her last one to 2 years she's increased her weight. She states this may have increased in size during this time as well. Patient transferred active, walks, sHe then goes to the gym.  Patient states that her umbilical pain has been on and off. She's had no previous abdominal surgery.    Past Surgical History  Tonsillectomy   Diagnostic Studies History Colonoscopy  within last year Mammogram  within last year Pap Smear  1-5 years ago  Allergies  Penicillins  Sulfa Drugs  Acetaminophen   Medication History  Qvar RediHaler (40MCG/ACT Aero Breath Act, Inhalation) Active. Probiotic (Oral) Active. Calcium Citrate (200MG  Tablet, Oral) Active. Medications Reconciled  Social History  Alcohol use  Occasional alcohol use. Caffeine use  Coffee, Tea. No drug use  Tobacco use  Never smoker.  Family History Arthritis  Mother. Breast Cancer  Mother. Cancer  Father, Mother. Colon Polyps  Father. Diabetes Mellitus  Father, Mother. Hypertension  Mother.  Pregnancy / Birth History  Age at menarche  65 years. Age of menopause  76-55 Gravida  0 Para  0  Other Problems  Asthma  Cancer  Gastroesophageal Reflux Disease     Review of Systems General Not Present- Appetite Loss, Chills, Fatigue, Fever, Night Sweats, Weight Gain and Weight Loss. Skin Not Present- Change in  Wart/Mole, Dryness, Hives, Jaundice, New Lesions, Non-Healing Wounds, Rash and Ulcer. HEENT Present- Wears glasses/contact lenses. Not Present- Earache, Hearing Loss, Hoarseness, Nose Bleed, Oral Ulcers, Ringing in the Ears, Seasonal Allergies, Sinus Pain, Sore Throat, Visual Disturbances and Yellow Eyes. Respiratory Not Present- Bloody sputum, Chronic Cough, Difficulty Breathing, Snoring and Wheezing. Breast Not Present- Breast Mass, Breast Pain, Nipple Discharge and Skin Changes. Cardiovascular Not Present- Chest Pain, Difficulty Breathing Lying Down, Leg Cramps, Palpitations, Rapid Heart Rate, Shortness of Breath and Swelling of Extremities. Gastrointestinal Present- Constipation. Not Present- Abdominal Pain, Bloating, Bloody Stool, Change in Bowel Habits, Chronic diarrhea, Difficulty Swallowing, Excessive gas, Gets full quickly at meals, Hemorrhoids, Indigestion, Nausea, Rectal Pain and Vomiting. Female Genitourinary Not Present- Frequency, Nocturia, Painful Urination, Pelvic Pain and Urgency. Musculoskeletal Not Present- Back Pain, Joint Pain, Joint Stiffness, Muscle Pain, Muscle Weakness and Swelling of Extremities. Neurological Not Present- Decreased Memory, Fainting, Headaches, Numbness, Seizures, Tingling, Tremor, Trouble walking and Weakness. Psychiatric Present- Depression. Not Present- Anxiety, Bipolar, Change in Sleep Pattern, Fearful and Frequent crying. Endocrine Not Present- Cold Intolerance, Excessive Hunger, Hair Changes, Heat Intolerance, Hot flashes and New Diabetes. Hematology Not Present- Blood Thinners, Easy Bruising, Excessive bleeding, Gland problems, HIV and Persistent Infections. All other systems negative   Physical Exam The physical exam findings are as follows: Note:Constitutional: No acute distress, conversant, appears stated age  Eyes: Anicteric sclerae, moist conjunctiva, no lid lag  Neck: No thyromegaly, trachea midline, no cervical  lymphadenopathy  Lungs: Clear to auscultation biilaterally, normal respiratory effot  Cardiovascular: regular rate & rhythm, no murmurs, no peripheal edema, pedal pulses 2+  GI: Soft, no masses  or hepatosplenomegaly, non-tender to palpation  MSK: Normal gait, no clubbing cyanosis, edema  Skin: No rashes, palpation reveals normal skin turgor  Psychiatric: Appropriate judgment and insight, oriented to person, place, and time  Abdomen Inspection Hernias - Ventral - Reducible(Umbilical hernia, small, 1 cm, left lower quadrants healing hernia large, fat-containing). Incisional hernia - Reducible. Umbilical hernia - Reducible.    Assessment & Plan  VENTRAL HERNIA WITHOUT OBSTRUCTION OR GANGRENE (K43.9) Impression: Patient is a 63 year old female with an umbilical hernia and left lower quadrant spigelian hernia. 1. The patient will like to proceed to the operating room for robotic umbilical and left spigelian hernia repair with mesh.  2. I discussed with the patient the signs and symptoms of incarceration and strangulation and the need to proceed to the ER should they occur.  3. I discussed with the patient the risks and benefits of the procedure to include but not limited to: Infection, bleeding, damage to surrounding structures, possible need for further surgery, possible nerve pain, and possible recurrence. The patient was understanding and wishes to proceed.

## 2019-06-12 ENCOUNTER — Encounter (HOSPITAL_COMMUNITY): Payer: Self-pay | Admitting: *Deleted

## 2019-06-12 ENCOUNTER — Ambulatory Visit (HOSPITAL_COMMUNITY): Payer: BC Managed Care – PPO | Admitting: Physician Assistant

## 2019-06-12 ENCOUNTER — Encounter (HOSPITAL_COMMUNITY)
Admission: RE | Disposition: A | Discharge status: Home / Self Care | Payer: Self-pay | Source: Home / Self Care | Attending: General Surgery

## 2019-06-12 ENCOUNTER — Ambulatory Visit (HOSPITAL_COMMUNITY): Payer: BC Managed Care – PPO | Admitting: Anesthesiology

## 2019-06-12 ENCOUNTER — Ambulatory Visit (HOSPITAL_COMMUNITY)
Admission: RE | Admit: 2019-06-12 | Discharge: 2019-06-12 | Disposition: A | Discharge status: Home / Self Care | Payer: BC Managed Care – PPO | Attending: General Surgery | Admitting: General Surgery

## 2019-06-12 DIAGNOSIS — J45909 Unspecified asthma, uncomplicated: Secondary | ICD-10-CM | POA: Insufficient documentation

## 2019-06-12 DIAGNOSIS — K439 Ventral hernia without obstruction or gangrene: Secondary | ICD-10-CM | POA: Insufficient documentation

## 2019-06-12 DIAGNOSIS — Z882 Allergy status to sulfonamides status: Secondary | ICD-10-CM | POA: Diagnosis not present

## 2019-06-12 DIAGNOSIS — Z7951 Long term (current) use of inhaled steroids: Secondary | ICD-10-CM | POA: Insufficient documentation

## 2019-06-12 DIAGNOSIS — Z880 Allergy status to penicillin: Secondary | ICD-10-CM | POA: Diagnosis not present

## 2019-06-12 DIAGNOSIS — K436 Other and unspecified ventral hernia with obstruction, without gangrene: Secondary | ICD-10-CM | POA: Insufficient documentation

## 2019-06-12 DIAGNOSIS — Z886 Allergy status to analgesic agent status: Secondary | ICD-10-CM | POA: Insufficient documentation

## 2019-06-12 DIAGNOSIS — Z79899 Other long term (current) drug therapy: Secondary | ICD-10-CM | POA: Diagnosis not present

## 2019-06-12 HISTORY — PX: XI ROBOTIC ASSISTED VENTRAL HERNIA: SHX6789

## 2019-06-12 SURGERY — REPAIR, HERNIA, VENTRAL, ROBOT-ASSISTED
Anesthesia: General | Site: Abdomen

## 2019-06-12 MED ORDER — KETAMINE HCL 10 MG/ML IJ SOLN
INTRAMUSCULAR | Status: DC | PRN
  Administered 2019-06-12: 50 mg via INTRAVENOUS

## 2019-06-12 MED ORDER — ROCURONIUM BROMIDE 10 MG/ML (PF) SYRINGE
PREFILLED_SYRINGE | INTRAVENOUS | Status: DC | PRN
  Administered 2019-06-12: 10 mg via INTRAVENOUS
  Administered 2019-06-12: 50 mg via INTRAVENOUS
  Administered 2019-06-12: 10 mg via INTRAVENOUS

## 2019-06-12 MED ORDER — HYDROMORPHONE HCL 1 MG/ML IJ SOLN: 0.2500 mg | INTRAMUSCULAR | Status: DC | PRN

## 2019-06-12 MED ORDER — CHLORHEXIDINE GLUCONATE CLOTH 2 % EX PADS: 6.0000 | MEDICATED_PAD | Freq: Once | CUTANEOUS | Status: DC

## 2019-06-12 MED ORDER — FENTANYL CITRATE (PF) 250 MCG/5ML IJ SOLN
INTRAMUSCULAR | Status: DC | PRN
  Administered 2019-06-12: 50 ug via INTRAVENOUS
  Administered 2019-06-12: 100 ug via INTRAVENOUS
  Administered 2019-06-12 (×2): 50 ug via INTRAVENOUS

## 2019-06-12 MED ORDER — LACTATED RINGERS IR SOLN
Status: DC | PRN
  Administered 2019-06-12: 1000 mL

## 2019-06-12 MED ORDER — EPHEDRINE SULFATE-NACL 50-0.9 MG/10ML-% IV SOSY
PREFILLED_SYRINGE | INTRAVENOUS | Status: DC | PRN
  Administered 2019-06-12 (×2): 10 mg via INTRAVENOUS

## 2019-06-12 MED ORDER — PROPOFOL 10 MG/ML IV BOLUS
INTRAVENOUS | Status: AC
  Filled 2019-06-12: qty 20

## 2019-06-12 MED ORDER — LIDOCAINE 2% (20 MG/ML) 5 ML SYRINGE
INTRAMUSCULAR | Status: DC | PRN
  Administered 2019-06-12: 60 mg via INTRAVENOUS

## 2019-06-12 MED ORDER — LIDOCAINE 2% (20 MG/ML) 5 ML SYRINGE
INTRAMUSCULAR | Status: DC | PRN
  Administered 2019-06-12: 1.5 mg/kg/h via INTRAVENOUS

## 2019-06-12 MED ORDER — BUPIVACAINE HCL (PF) 0.25 % IJ SOLN
INTRAMUSCULAR | Status: DC | PRN
  Administered 2019-06-12: 19 mL

## 2019-06-12 MED ORDER — MEPERIDINE HCL 50 MG/ML IJ SOLN: 6.2500 mg | INTRAMUSCULAR | Status: DC | PRN

## 2019-06-12 MED ORDER — BUPIVACAINE HCL (PF) 0.25 % IJ SOLN
INTRAMUSCULAR | Status: AC
  Filled 2019-06-12: qty 30

## 2019-06-12 MED ORDER — ROCURONIUM BROMIDE 10 MG/ML (PF) SYRINGE
PREFILLED_SYRINGE | INTRAVENOUS | Status: AC
  Filled 2019-06-12: qty 10

## 2019-06-12 MED ORDER — LIDOCAINE 2% (20 MG/ML) 5 ML SYRINGE
INTRAMUSCULAR | Status: AC
  Filled 2019-06-12: qty 5

## 2019-06-12 MED ORDER — LACTATED RINGERS IV SOLN: INTRAVENOUS | Status: DC

## 2019-06-12 MED ORDER — ACETAMINOPHEN 325 MG PO TABS: 325.0000 mg | ORAL_TABLET | Freq: Once | ORAL | Status: DC | PRN

## 2019-06-12 MED ORDER — GABAPENTIN 300 MG PO CAPS
300.0000 mg | ORAL_CAPSULE | ORAL | Status: AC
  Administered 2019-06-12: 300 mg via ORAL
  Filled 2019-06-12: qty 1

## 2019-06-12 MED ORDER — LACTATED RINGERS IV SOLN
INTRAVENOUS | Status: DC
  Administered 2019-06-12: 13:00:00 via INTRAVENOUS

## 2019-06-12 MED ORDER — ACETAMINOPHEN 160 MG/5ML PO SOLN: 325.0000 mg | Freq: Once | ORAL | Status: DC | PRN

## 2019-06-12 MED ORDER — PROMETHAZINE HCL 25 MG/ML IJ SOLN: 6.2500 mg | INTRAMUSCULAR | Status: DC | PRN

## 2019-06-12 MED ORDER — DEXAMETHASONE SODIUM PHOSPHATE 10 MG/ML IJ SOLN
INTRAMUSCULAR | Status: DC | PRN
  Administered 2019-06-12: 10 mg via INTRAVENOUS

## 2019-06-12 MED ORDER — ACETAMINOPHEN 500 MG PO TABS
1000.0000 mg | ORAL_TABLET | ORAL | Status: AC
  Administered 2019-06-12: 1000 mg via ORAL
  Filled 2019-06-12: qty 2

## 2019-06-12 MED ORDER — FENTANYL CITRATE (PF) 250 MCG/5ML IJ SOLN
INTRAMUSCULAR | Status: AC
  Filled 2019-06-12: qty 5

## 2019-06-12 MED ORDER — PROPOFOL 10 MG/ML IV BOLUS
INTRAVENOUS | Status: DC | PRN
  Administered 2019-06-12: 100 mg via INTRAVENOUS

## 2019-06-12 MED ORDER — SUGAMMADEX SODIUM 200 MG/2ML IV SOLN
INTRAVENOUS | Status: DC | PRN
  Administered 2019-06-12: 400 mg via INTRAVENOUS

## 2019-06-12 MED ORDER — DEXAMETHASONE SODIUM PHOSPHATE 10 MG/ML IJ SOLN
INTRAMUSCULAR | Status: AC
  Filled 2019-06-12: qty 1

## 2019-06-12 MED ORDER — 0.9 % SODIUM CHLORIDE (POUR BTL) OPTIME
TOPICAL | Status: DC | PRN
  Administered 2019-06-12: 15:00:00 1000 mL

## 2019-06-12 MED ORDER — BUPIVACAINE LIPOSOME 1.3 % IJ SUSP
20.0000 mL | Freq: Once | INTRAMUSCULAR | Status: AC
  Administered 2019-06-12: 16:00:00 5 mL
  Filled 2019-06-12: qty 20

## 2019-06-12 MED ORDER — MIDAZOLAM HCL 2 MG/2ML IJ SOLN
INTRAMUSCULAR | Status: DC | PRN
  Administered 2019-06-12: 2 mg via INTRAVENOUS

## 2019-06-12 MED ORDER — ACETAMINOPHEN 10 MG/ML IV SOLN: 1000.0000 mg | Freq: Once | INTRAVENOUS | Status: DC | PRN

## 2019-06-12 MED ORDER — MIDAZOLAM HCL 2 MG/2ML IJ SOLN
INTRAMUSCULAR | Status: AC
  Filled 2019-06-12: qty 2

## 2019-06-12 MED ORDER — VANCOMYCIN HCL IN DEXTROSE 1-5 GM/200ML-% IV SOLN
1000.0000 mg | INTRAVENOUS | Status: AC
  Administered 2019-06-12: 1000 mg via INTRAVENOUS
  Filled 2019-06-12: qty 200

## 2019-06-12 MED ORDER — TRAMADOL HCL 50 MG PO TABS: 50.0000 mg | ORAL_TABLET | Freq: Four times a day (QID) | ORAL | 0 refills | Status: AC | PRN

## 2019-06-12 MED ORDER — ONDANSETRON HCL 4 MG/2ML IJ SOLN
INTRAMUSCULAR | Status: AC
  Filled 2019-06-12: qty 2

## 2019-06-12 MED ORDER — ONDANSETRON HCL 4 MG/2ML IJ SOLN
INTRAMUSCULAR | Status: DC | PRN
  Administered 2019-06-12: 4 mg via INTRAVENOUS

## 2019-06-12 SURGICAL SUPPLY — 63 items
ADH SKN CLS APL DERMABOND .7 (GAUZE/BANDAGES/DRESSINGS) ×1
APL PRP STRL LF DISP 70% ISPRP (MISCELLANEOUS) ×1
BLADE SURG SZ11 CARB STEEL (BLADE) ×3 IMPLANT
CHLORAPREP W/TINT 26 (MISCELLANEOUS) ×3 IMPLANT
COVER SURGICAL LIGHT HANDLE (MISCELLANEOUS) ×3 IMPLANT
COVER TIP SHEARS 8 DVNC (MISCELLANEOUS) ×1 IMPLANT
COVER TIP SHEARS 8MM DA VINCI (MISCELLANEOUS) ×2
COVER WAND RF STERILE (DRAPES) IMPLANT
DECANTER SPIKE VIAL GLASS SM (MISCELLANEOUS) ×3 IMPLANT
DERMABOND ADVANCED (GAUZE/BANDAGES/DRESSINGS) ×2
DERMABOND ADVANCED .7 DNX12 (GAUZE/BANDAGES/DRESSINGS) ×1 IMPLANT
DEVICE SECURE STRAP 25 ABSORB (INSTRUMENTS) ×1 IMPLANT
DEVICE TROCAR PUNCTURE CLOSURE (ENDOMECHANICALS) ×3 IMPLANT
DRAPE ARM DVNC X/XI (DISPOSABLE) ×4 IMPLANT
DRAPE COLUMN DVNC XI (DISPOSABLE) ×1 IMPLANT
DRAPE CV SPLIT W-CLR ANES SCRN (DRAPES) ×2 IMPLANT
DRAPE DA VINCI XI ARM (DISPOSABLE) ×8
DRAPE DA VINCI XI COLUMN (DISPOSABLE) ×2
DRAPE SHEET LG 3/4 BI-LAMINATE (DRAPES) ×2 IMPLANT
ELECT REM PT RETURN 15FT ADLT (MISCELLANEOUS) ×3 IMPLANT
GLOVE BIO SURGEON STRL SZ 6.5 (GLOVE) ×1 IMPLANT
GLOVE BIO SURGEON STRL SZ7.5 (GLOVE) ×6 IMPLANT
GLOVE BIO SURGEON STRL SZ8 (GLOVE) ×2 IMPLANT
GLOVE BIO SURGEONS STRL SZ 6.5 (GLOVE) ×1
GLOVE BIOGEL PI IND STRL 6.5 (GLOVE) IMPLANT
GLOVE BIOGEL PI INDICATOR 6.5 (GLOVE) ×2
GLOVE INDICATOR 8.0 STRL GRN (GLOVE) ×2 IMPLANT
GLOVE SURG SS PI 7.0 STRL IVOR (GLOVE) ×2 IMPLANT
GOWN STRL REUS W/TWL XL LVL3 (GOWN DISPOSABLE) ×11 IMPLANT
KIT BASIN OR (CUSTOM PROCEDURE TRAY) ×3 IMPLANT
KIT TURNOVER KIT A (KITS) ×3 IMPLANT
MARKER SKIN DUAL TIP RULER LAB (MISCELLANEOUS) ×2 IMPLANT
MESH POLY 12X12 NONABS ASK (Mesh General) ×2 IMPLANT
NDL INSUFFLATION 14GA 120MM (NEEDLE) ×1 IMPLANT
NEEDLE HYPO 22GX1.5 SAFETY (NEEDLE) ×3 IMPLANT
NEEDLE INSUFFLATION 14GA 120MM (NEEDLE) ×3 IMPLANT
OBTURATOR OPTICAL STANDARD 8MM (TROCAR)
OBTURATOR OPTICAL STND 8 DVNC (TROCAR)
OBTURATOR OPTICALSTD 8 DVNC (TROCAR) IMPLANT
PACK GENERAL/GYN (CUSTOM PROCEDURE TRAY) ×2 IMPLANT
PAD POSITIONING PINK XL (MISCELLANEOUS) ×2 IMPLANT
SCISSORS LAP 5X35 DISP (ENDOMECHANICALS) ×3 IMPLANT
SEAL CANN UNIV 5-8 DVNC XI (MISCELLANEOUS) ×4 IMPLANT
SEAL XI 5MM-8MM UNIVERSAL (MISCELLANEOUS) ×8
SET IRRIG TUBING LAPAROSCOPIC (IRRIGATION / IRRIGATOR) ×3 IMPLANT
SOL ANTI FOG 6CC (MISCELLANEOUS) ×1 IMPLANT
SOLUTION ANTI FOG 6CC (MISCELLANEOUS) ×2
SOLUTION ELECTROLUBE (MISCELLANEOUS) ×3 IMPLANT
SUT MNCRL AB 4-0 PS2 18 (SUTURE) ×3 IMPLANT
SUT PROLENE 2 0 KS (SUTURE) ×6 IMPLANT
SUT V-LOC BARB 180 2/0GR6 GS22 (SUTURE) ×3
SUT VIC AB 2-0 SH 27 (SUTURE) ×16
SUT VIC AB 2-0 SH 27XBRD (SUTURE) ×8 IMPLANT
SUT VIC AB 3-0 SH 27 (SUTURE) ×3
SUT VIC AB 3-0 SH 27X BRD (SUTURE) ×1 IMPLANT
SUT VLOC 180 2-0 9IN GS21 (SUTURE) ×7 IMPLANT
SUTURE V-LC BRB 180 2/0GR6GS22 (SUTURE) IMPLANT
SYR 10ML LL (SYRINGE) ×3 IMPLANT
SYR 20ML LL LF (SYRINGE) ×3 IMPLANT
TOWEL OR 17X26 10 PK STRL BLUE (TOWEL DISPOSABLE) ×3 IMPLANT
TOWEL OR NON WOVEN STRL DISP B (DISPOSABLE) ×3 IMPLANT
TROCAR ADV FIXATION 5X100MM (TROCAR) ×2 IMPLANT
TUBING INSUFFLATION 10FT LAP (TUBING) ×3 IMPLANT

## 2019-06-12 NOTE — Transfer of Care (Signed)
Immediate Anesthesia Transfer of Care Note  Patient: Jody [name]  Procedure(s) Performed: ROBOTIC UMBILICAL AND SPIGELIAN HERNIA REPAIR WITH MESH (N/A Abdomen)  Patient Location: PACU  Anesthesia Type:General  Level of Consciousness: drowsy  Airway & Oxygen Therapy: Patient Spontanous Breathing and Patient connected to face mask oxygen  Post-op Assessment: Report given to RN and Post -op Vital signs reviewed and stable  Post vital signs: Reviewed and stable  Last Vitals:  Vitals Value Taken Time  BP 149/73 06/12/19 1603  Temp    Pulse 87 06/12/19 1604  Resp 17 06/12/19 1604  SpO2 100 % 06/12/19 1604  Vitals shown include unvalidated device data.  Last Pain:  Vitals:   06/12/19 1253  TempSrc: Oral         Complications: No apparent anesthesia complications

## 2019-06-12 NOTE — Anesthesia Postprocedure Evaluation (Signed)
Anesthesia Post Note  Patient: Jody [name]  Procedure(s) Performed: ROBOTIC UMBILICAL AND SPIGELIAN HERNIA REPAIR WITH MESH (N/A Abdomen)     Patient location during evaluation: PACU Anesthesia Type: General Level of consciousness: awake and alert Pain management: pain level controlled Vital Signs Assessment: post-procedure vital signs reviewed and stable Respiratory status: spontaneous breathing, nonlabored ventilation, respiratory function stable and patient connected to nasal cannula oxygen Cardiovascular status: blood pressure returned to baseline and stable Postop Assessment: no apparent nausea or vomiting Anesthetic complications: no    Last Vitals:  Vitals:   06/12/19 1615 06/12/19 1630  BP: (!) 95/58 (!) 109/55  Pulse: 84 85  Resp: 17 19  Temp:  (!) 36.3 C  SpO2: 100% 100%    Last Pain:  Vitals:   06/12/19 1630  TempSrc:   PainSc: [place] Susman

## 2019-06-12 NOTE — Discharge Instructions (Signed)
[facility] _______[facility] ° °HERNIA REPAIR: POST OP INSTRUCTIONS ° °Always review your discharge instruction sheet given to you by the facility where your [name] was performed. °IF YOU HAVE DISABILITY OR FAMILY LEAVE FORMS, YOU MUST BRING THEM TO THE OFFICE FOR PROCESSING.   °DO NOT GIVE THEM TO YOUR DOCTOR. ° °1. A  prescription for pain medication may be given to you upon discharge.  Take your pain medication as prescribed, if needed.  If narcotic pain medicine is not needed, then you may take acetaminophen (Tylenol) or ibuprofen (Advil) as needed. °2. Take your usually prescribed medications unless otherwise directed. °If you need a refill on your pain medication, please contact your pharmacy.  They will contact our office to request authorization. Prescriptions will not be filled after 5 pm or on week-ends. °3. You should follow a light diet the first 24 hours after arrival home, such as soup and crackers, etc.  Be sure to include lots of fluids daily.  Resume your normal diet the day after [name]. °4.Most patients will experience some swelling and bruising around the umbilicus or in the groin and scrotum.  Ice packs and reclining will help.  Swelling and bruising can take several days to resolve.  °6. It is common to experience some constipation if taking pain medication after [name].  Increasing fluid intake and taking a stool softener (such as Colace) will usually help or prevent this problem from occurring.  A mild laxative (Milk of Magnesia or Miralax) should be taken according to package directions if there are no bowel movements after 48 hours. °7. Unless discharge instructions indicate otherwise, you may remove your bandages 24-48 hours after [name], and you may shower at that time.  You may have steri-strips (small skin tapes) in place directly over the incision.  These strips should be left on the skin for 7-10 days.  If your surgeon used skin glue on the incision, you may shower in  24 hours.  The glue will flake off over the next 2-3 weeks.  Any sutures or staples will be removed at the office during your follow-up visit. °8. ACTIVITIES:  You may resume regular (light) daily activities beginning the next day--such as daily self-care, walking, climbing stairs--gradually increasing activities as tolerated.  You may have sexual intercourse when it is comfortable.  Refrain from any heavy lifting or straining until approved by your doctor. ° °a.You may drive when you are no longer taking prescription pain medication, you can comfortably wear a seatbelt, and you can safely maneuver your car and apply brakes. °b.RETURN TO WORK:   °_____________________________________________ ° °9.You should see your doctor in the office for a follow-up appointment approximately 2-3 weeks after your [name].  Make sure that you call for this appointment within a day or two after you arrive home to insure a convenient appointment time. °10.OTHER INSTRUCTIONS: _________________________ °   _____________________________________ ° °WHEN TO CALL YOUR DOCTOR: °1. Fever over 101.0 °2. Inability to urinate °3. Nausea and/or vomiting °4. Extreme swelling or bruising °5. Continued bleeding from incision. °6. Increased pain, redness, or drainage from the incision ° °The clinic staff is available to answer your questions during regular business hours.  Please don’t hesitate to call and ask to speak to one of the nurses for clinical concerns.  If you have a medical emergency, go to the nearest emergency room or call 911.  A surgeon from [name] is always on call at the hospital ° ° °[address]   [address], [place], [place], [place]  [address] ?  [address], [place], [place]   [address] [phone] ? [phone] ? FAX [phone] Web site: [email or web address]   General Anesthesia, Adult, Care After This sheet gives you information about how to care for yourself after your procedure. Your  health care provider may also give you more specific instructions. If you have problems or questions, contact your health care provider. What can I expect after the procedure? After the procedure, the following side effects are common:  Pain or discomfort at the IV site.  Nausea.  Vomiting.  Sore throat.  Trouble concentrating.  Feeling cold or chills.  Weak or tired.  Sleepiness and fatigue.  Soreness and body aches. These side effects can affect parts of the body that were not involved in [name]. Follow these instructions at home:  For at least 24 hours after the procedure:  Have a responsible adult stay with you. It is important to have someone help care for you until you are awake and alert.  Rest as needed.  Do not: ? Participate in activities in which you could fall or become injured. ? Drive. ? Use heavy machinery. ? Drink alcohol. ? Take sleeping pills or medicines that cause drowsiness. ? Make important decisions or sign legal documents. ? Take care of children on your own. Eating and drinking  Follow any instructions from your health care provider about eating or drinking restrictions.  When you feel hungry, start by eating small amounts of foods that are soft and easy to digest (bland), such as toast. Gradually return to your regular diet.  Drink enough fluid to keep your urine pale yellow.  If you vomit, rehydrate by drinking water, juice, or clear broth. General instructions  If you have sleep apnea, [name] and certain medicines can increase your risk for breathing problems. Follow instructions from your health care provider about wearing your sleep device: ? Anytime you are sleeping, including during daytime naps. ? While taking prescription pain medicines, sleeping medicines, or medicines that make you drowsy.  Return to your normal activities as told by your health care provider. Ask your health care provider what activities are safe for  you.  Take over-the-counter and prescription medicines only as told by your health care provider.  If you smoke, do not smoke without supervision.  Keep all follow-up visits as told by your health care provider. This is important. Contact a health care provider if:  You have nausea or vomiting that does not get better with medicine.  You cannot eat or drink without vomiting.  You have pain that does not get better with medicine.  You are unable to pass urine.  You develop a skin rash.  You have a fever.  You have redness around your IV site that gets worse. Get help right away if:  You have difficulty breathing.  You have chest pain.  You have blood in your urine or stool, or you vomit blood. Summary  After the procedure, it is common to have a sore throat or nausea. It is also common to feel tired.  Have a responsible adult stay with you for the first 24 hours after general anesthesia. It is important to have someone help care for you until you are awake and alert.  When you feel hungry, start by eating small amounts of foods that are soft and easy to digest (bland), such as toast. Gradually return to your regular diet.  Drink enough fluid to keep your  urine pale yellow.  Return to your normal activities as told by your health care provider. Ask your health care provider what activities are safe for you. This information is not intended to replace advice given to you by your health care provider. Make sure you discuss any questions you have with your health care provider. Document Released: 01/07/2001 Document Revised: 10/04/2017 Document Reviewed: 05/17/2017 Elsevier Patient Education  2020 [employer].

## 2019-06-12 NOTE — Op Note (Signed)
06/12/2019  3:45 PM  PATIENT:  Jody [name]  63 y.o. female  PRE-OPERATIVE DIAGNOSIS:  UMBILICAL AND SPIGELIAN HERNIA AND LATERAL ABDOMINAL WALL HERNIA  POST-OPERATIVE DIAGNOSIS:  UMBILICAL AND INCARCERATED SPIGELIAN HERNIA AND LATERAL ABDOMINAL WALL HERNIA   PROCEDURE:  Procedure(s): ROBOTIC UMBILICAL AND SPIGELIAN AND LATERAL HERNIA REPAIR WITH MESH (N/A)  SURGEON:  Surgeon(s) and Role:    * Ralene Ok, MD - Primary    Michael Boston, MD - Assisting  ANESTHESIA:   local and general  EBL:  5 mL   BLOOD ADMINISTERED:none  DRAINS: none   LOCAL MEDICATIONS USED:  MARCAINE  AND EXPAREL  SPECIMEN:  No Specimen  DISPOSITION OF SPECIMEN:  N/A  COUNTS:  YES  TOURNIQUET:  * No tourniquets in log *  DICTATION: .Dragon Dictation Details of procedure: After the patient was consented he was taken back to the OR and placed in the supine position with bilateral SCDs in place. He underwent general endotracheal anesthesia. Patient was prepped and draped in standard fashion. Timeout was called all facts verified.  Veress needle technique was used to insufflate the abdomen at the right subcostal margin 16mm of Hg. Subsequent to this, 73mm trocar was then placed under the right subcostal margin. Subsequent to this a camera was placed intra-abdominally. There is no injury to any abdominal organs. At this time two 8 mm ports were placed in the right anterior axillary line under direct visualization. At this time the robot was docked.   There was some apparent chronic incarceration of the left lateral abdominal hernia.  This was mainly omentum.  This was reduced in its entirety.  This was done with both sharp and blunt dissection.  This cleared the entire lateral abdominal wall hernia.  At this time an area of distal to the right lateral side of the midline was chosen and the peritoneum was taken down from a cephalad to caudad direction. The peritoneum was taken down from the posterior  rectus sheath. This was done across the midline and over to the left side of the abdomen. The hernia sac was encountered. This was reduced in its entirety. The peritoneal fat was incarcerated in the hernia. This was reduced entirely. I proceeded to continue the dissection of the peritoneum caudad. Ultimately a pocket measuring approximately 15 x 18 cm in size was created in the preperitoneal space.  Should be noted there was 2 separate hernias in the lateral abdominal wall, a classic spigelian hernia as well as a lateral abdominal wall hernia.  The lateral abdominal wall hernia was approximately 3 cm in diameter.  At this time and 9 inch 0 V-lock suture was used to reapproximate the spigelian hernia and a 6 inch V lock suture was used to reapproximate the lateral abdominal wall hernia.   The umbilical hernia fascia was not closed.   At this time a piece of Bard 30 x 30 cm mesh was trimmed. This was placed into the abdominal cavity. This was placed in the preperitoneal pocket. This mesh lay flat.  A 2-0 Vicryl was used to help orient the mesh and tacked to the posterior rectus fascia.  The peritoneum was then reapproximated using a 2 oh VueLock suture. The omentum was then brought over the viscera. The insufflation was evacuated. The robot was undocked. All trochars were removed.  The trocar sites were then reapproximated using 4-0 Monocryl subcuticular fashion. The skin was dressed with Dermabond. The patient had a procedure well was taken to the recovery room  stable condition.       PLAN OF CARE: Discharge to home after PACU  PATIENT DISPOSITION:  PACU - hemodynamically stable.   Delay start of Pharmacological VTE agent (>24hrs) due to surgical blood loss or risk of bleeding: not applicable

## 2019-06-12 NOTE — Anesthesia Procedure Notes (Addendum)
Procedure Name: Intubation Date/Time: 06/12/2019 1:56 PM Performed by: Sharlette Dense, CRNA Patient Re-evaluated:Patient Re-evaluated prior to induction Oxygen Delivery Method: Circle system utilized Preoxygenation: Pre-oxygenation with 100% oxygen Induction Type: IV induction Ventilation: Mask ventilation without difficulty and Oral airway inserted - appropriate to patient size Laryngoscope Size: Sabra Heck and 2 Grade View: Grade I Tube type: Oral Tube size: 7.5 mm Number of attempts: 1 Airway Equipment and Method: Stylet Placement Confirmation: ETT inserted through vocal cords under direct vision,  positive ETCO2 and breath sounds checked- equal and bilateral Secured at: 21 cm Tube secured with: Tape Dental Injury: Teeth and Oropharynx as per pre-operative assessment

## 2019-06-12 NOTE — Interval H&P Note (Signed)
History and Physical Interval Note:  06/12/2019 12:58 PM  Jody [name]  has presented today for surgery, with the diagnosis of UMBILICAL AND SPIGELIAN HERNIA.  The various methods of treatment have been discussed with the patient and family. After consideration of risks, benefits and other options for treatment, the patient has consented to  Procedure(s): ROBOTIC [place] (N/A) as a surgical intervention.  The patient's history has been reviewed, patient examined, no change in status, stable for surgery.  I have reviewed the patient's chart and labs.  Questions were answered to the patient's satisfaction.     Ralene Ok

## 2019-06-12 NOTE — Anesthesia Preprocedure Evaluation (Addendum)
Anesthesia Evaluation  Patient identified by MRN, date of birth, ID band Patient awake    Reviewed: Allergy & Precautions, NPO status , Patient's Chart, lab work & pertinent test results  Airway Mallampati: I  TM Distance: >3 FB Neck ROM: Full    Dental  (+) Teeth Intact, Dental Advisory Given   Pulmonary asthma ,    breath sounds clear to auscultation       Cardiovascular negative cardio ROS   Rhythm:Regular Rate:Normal     Neuro/Psych Depression    GI/Hepatic Neg liver ROS, GERD  ,  Endo/Other  negative endocrine ROS  Renal/GU negative Renal ROS     Musculoskeletal negative musculoskeletal ROS (+)   Abdominal Normal abdominal exam  (+)   Peds  Hematology negative hematology ROS (+)   Anesthesia Other Findings   Reproductive/Obstetrics                            Anesthesia Physical Anesthesia Plan  ASA: II  Anesthesia Plan: General   Post-op Pain Management:    Induction: Intravenous  PONV Risk Score and Plan: 4 or greater and Ondansetron, Dexamethasone, Midazolam and Treatment may vary due to age or medical condition  Airway Management Planned: Oral ETT  Additional Equipment: None  Intra-op Plan:   Post-operative Plan: Extubation in OR  Informed Consent: I have reviewed the patients History and Physical, chart, labs and discussed the procedure including the risks, benefits and alternatives for the proposed anesthesia with the patient or authorized representative who has indicated his/her understanding and acceptance.     Dental advisory given  Plan Discussed with: CRNA  Anesthesia Plan Comments:        Anesthesia Quick Evaluation

## 2019-06-13 ENCOUNTER — Encounter (HOSPITAL_COMMUNITY): Payer: Self-pay | Admitting: General Surgery

## 2019-06-15 ENCOUNTER — Encounter (HOSPITAL_COMMUNITY): Payer: Self-pay | Admitting: General Surgery

## 2019-07-10 ENCOUNTER — Telehealth: Payer: Self-pay | Admitting: Family Medicine

## 2019-07-10 NOTE — Telephone Encounter (Signed)
beclomethasone (QVAR REDIHALER) 40 MCG/ACT inhaler Pt requesting a refill in December because she does not want to come in

## 2019-07-13 ENCOUNTER — Ambulatory Visit: Payer: BC Managed Care – PPO

## 2019-07-14 ENCOUNTER — Other Ambulatory Visit: Payer: Self-pay | Admitting: *Deleted

## 2019-07-14 DIAGNOSIS — R062 Wheezing: Secondary | ICD-10-CM

## 2019-07-14 MED ORDER — QVAR REDIHALER 40 MCG/ACT IN AERB: INHALATION_SPRAY | RESPIRATORY_TRACT | 11 refills | Status: DC

## 2019-07-14 NOTE — Telephone Encounter (Signed)
Prescription sent

## 2019-08-20 ENCOUNTER — Telehealth: Payer: Self-pay | Admitting: Family Medicine

## 2019-08-20 NOTE — Telephone Encounter (Signed)
rx refill beclomethasone (QVAR REDIHALER) 40 MCG/ACT inhaler  PHARMACY [id number] [address], [place] [address] [place] (Phone) [phone] (Fax)

## 2019-10-26 IMAGING — US US ABDOMEN LIMITED
1 series · 13 of 19 positions shown · non-contrast
Comparison: None

CLINICAL DATA: LEFT side abdominal pain and palpable abnormality
towards the flank, soft mobile mass 5 inches long

EXAM:
ULTRASOUND ABDOMEN LIMITED

[Series 1: us abdomen limited · 0.16mm/px · 13 of 19 slices shown]
[im 1/19]
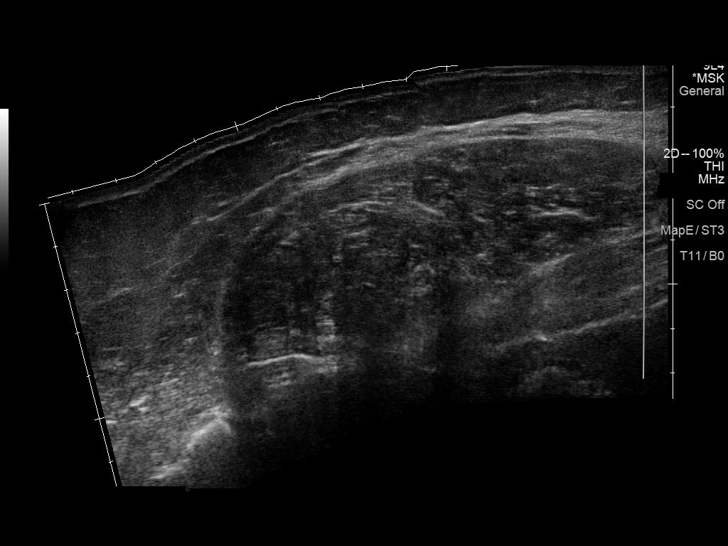
[im 3/19]
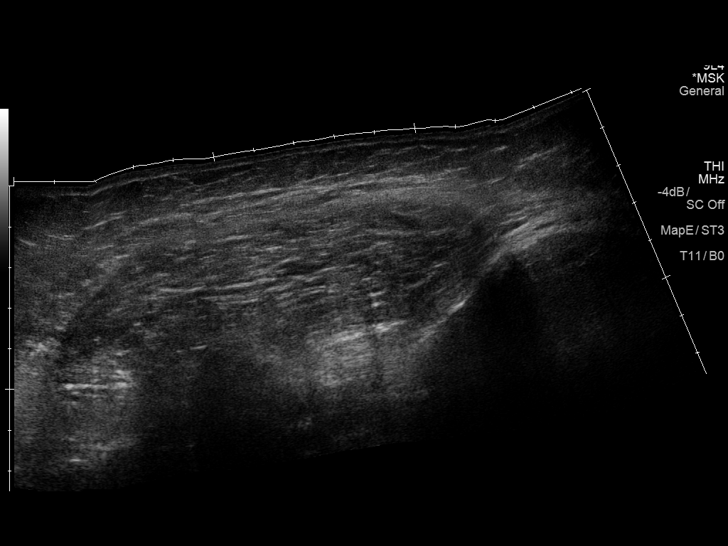
[im 4/19]
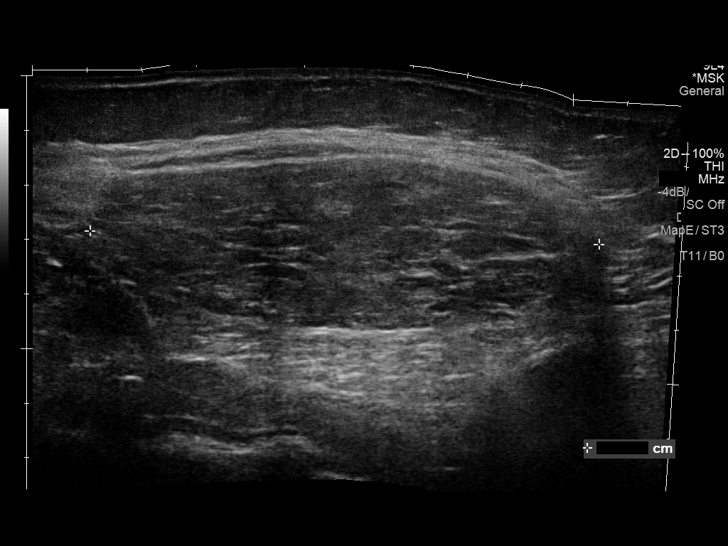
[im 6/19]
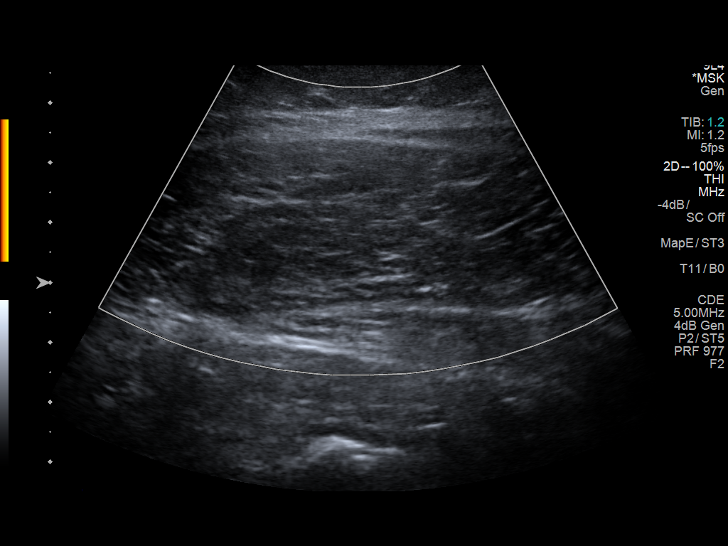
[im 7/19]
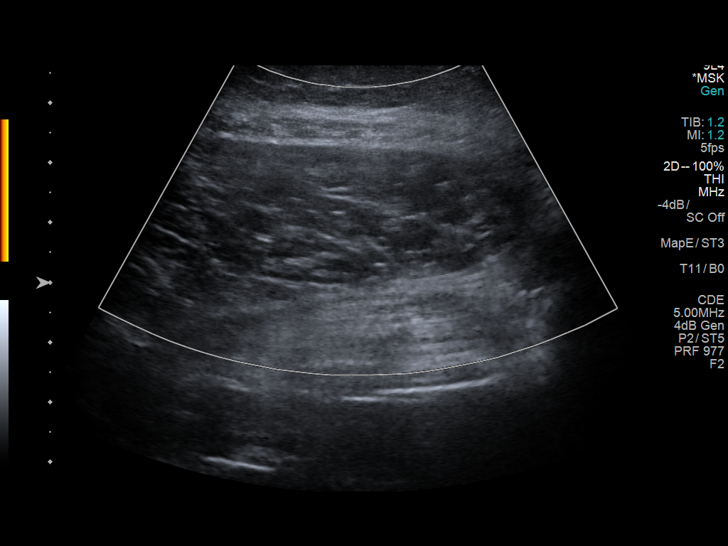
[im 9/19]
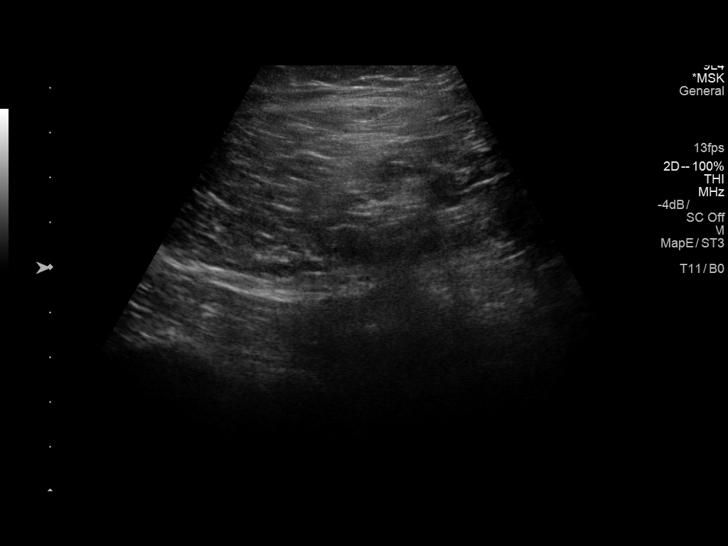
[im 10/19]
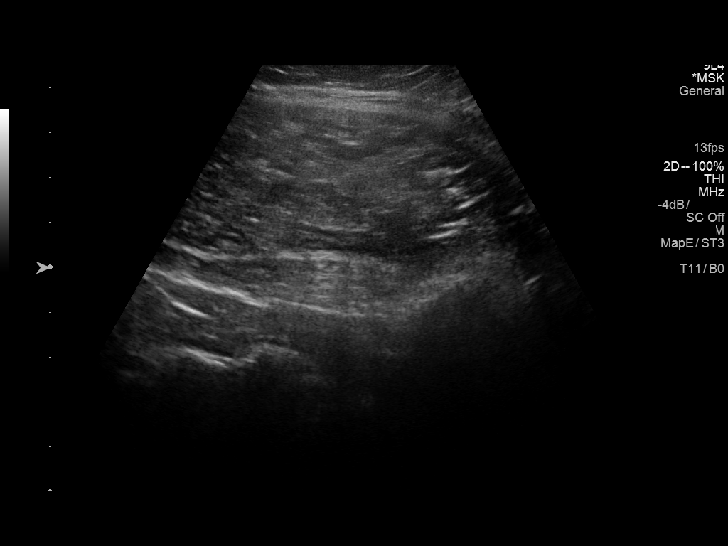
[im 11/19]
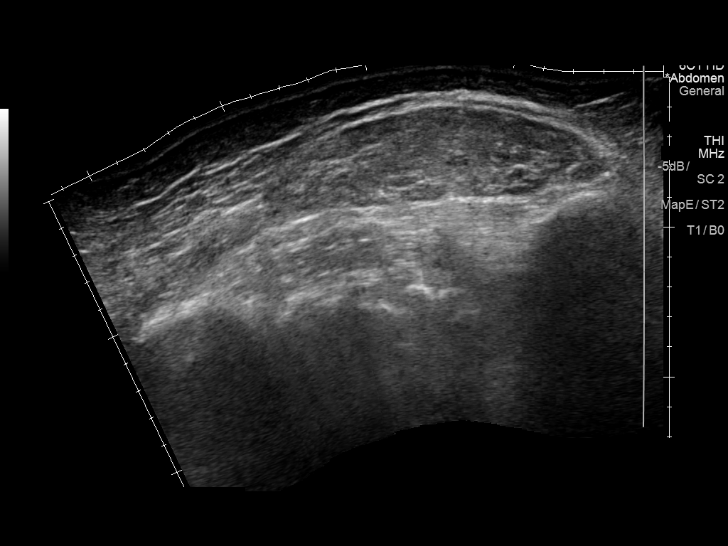
[im 13/19]
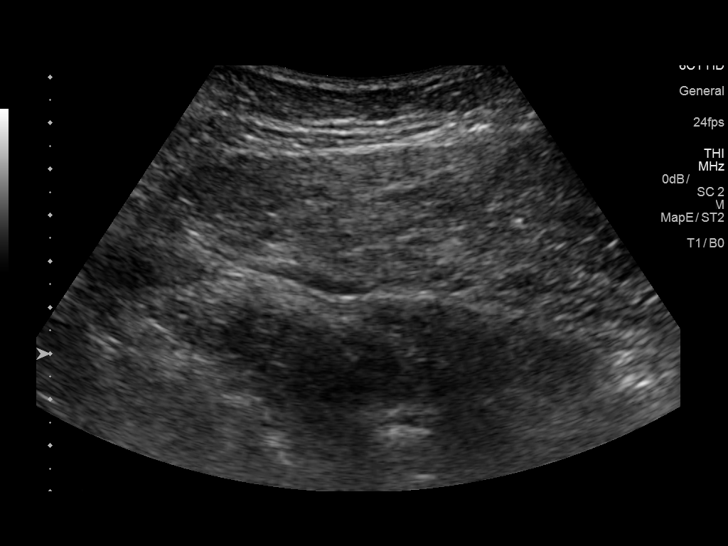
[im 14/19]
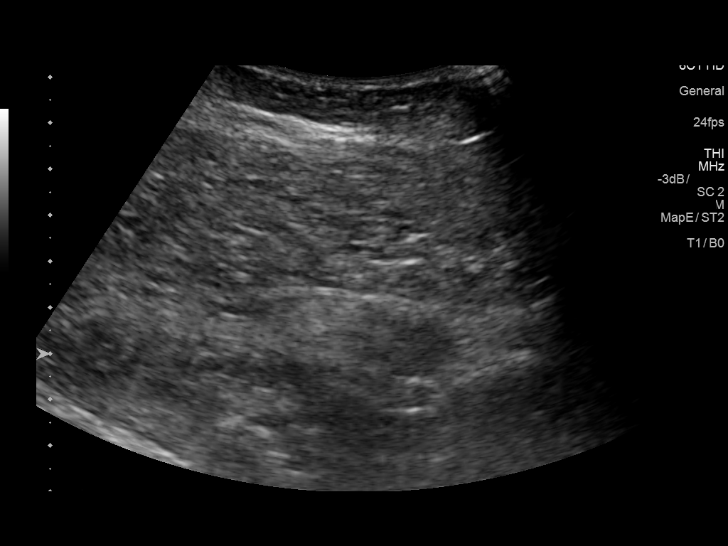
[im 16/19]
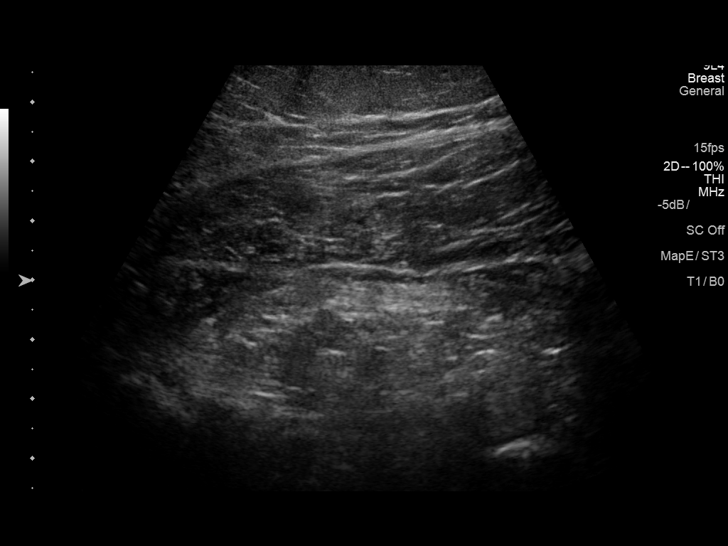
[im 17/19]
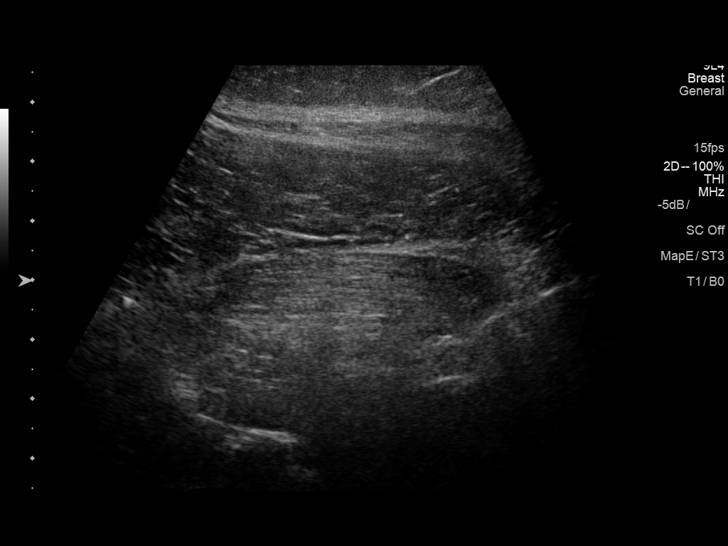
[im 19/19]
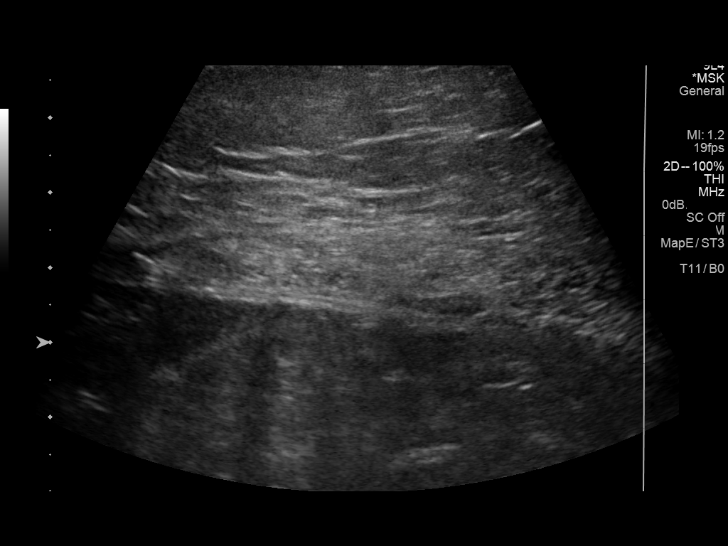

[13 of 19 positions shown; findings below may reference images not displayed]

FINDINGS: At the site of palpable concern at the lateral LEFT abdomen, a
complex lenticular subcutaneous mass is identified measuring 10.9 x
9.3 cm in size and measuring up to 3.1 cm thick.

Lesion has heterogeneous slight hypoechogenicity versus surrounding
subcutaneous fat.

No calcification or cystic foci are seen.

No internal blood flow on color Doppler imaging.

Due to a broad area of posterior shadowing slightly superiorly, it
is uncertain whether this contains a defined posterior wall.

This could represent a large subcutaneous lipoma or potentially a
ventral hernia such as a spigelian hernia, containing fat; no bowel
herniation is seen.
IMPRESSION: 10.9 x 9.3 x 3.1 cm diameter complex heterogeneous generally
isoechoic to hypoechoic mass at the site of palpable concern in the
LEFT abdomen, question subcutaneous lipoma versus ventral hernia
containing fat, potentially a spigelian hernia; portions of the
lesion demonstrate posterior shadowing and the posterior margin of
the lesion is incompletely defined slightly superiorly.

Recommend CT imaging of the abdomen to differentiate between
subcutaneous lipoma and fat containing ventral hernia.

## 2020-01-11 ENCOUNTER — Ambulatory Visit: Payer: BC Managed Care – PPO | Attending: Internal Medicine

## 2020-01-11 DIAGNOSIS — Z23 Encounter for immunization: Secondary | ICD-10-CM

## 2020-01-11 NOTE — Progress Notes (Signed)
   Covid-19 Vaccination Clinic  Name:  Jody [name]    MRN: [id number] DOB: January 26, 1956  01/11/2020  Ms. [name] was observed post Covid-19 immunization for 30 minutes based on pre-vaccination screening without incident. She was provided with Vaccine Information Sheet and instruction to access the V-Safe system.   Ms. [name] was instructed to call 911 with any severe reactions post vaccine: Marland Kitchen Difficulty breathing  . Swelling of face and throat  . A fast heartbeat  . A bad rash all over body  . Dizziness and weakness   Immunizations Administered    Name Date Dose VIS Date Route   Pfizer COVID-19 Vaccine 01/11/2020 12:47 PM 0.3 mL 09/25/2019 Intramuscular   Manufacturer: [place]   Lot: [id number]   [place]: [id number]

## 2020-02-02 ENCOUNTER — Ambulatory Visit: Payer: BC Managed Care – PPO | Attending: Internal Medicine

## 2020-02-02 DIAGNOSIS — Z23 Encounter for immunization: Secondary | ICD-10-CM

## 2020-02-02 NOTE — Progress Notes (Signed)
   Covid-19 Vaccination Clinic  Name:  Jody [name]    MRN: [id number] DOB: 1956-03-22  02/02/2020  Jody [name] was observed post Covid-19 immunization for 30 minutes based on pre-vaccination screening without incident. She was provided with Vaccine Information Sheet and instruction to access the V-Safe system.   Jody [name] was instructed to call 911 with any severe reactions post vaccine: Marland Kitchen Difficulty breathing  . Swelling of face and throat  . A fast heartbeat  . A bad rash all over body  . Dizziness and weakness   Immunizations Administered    Name Date Dose VIS Date Route   Pfizer COVID-19 Vaccine 02/02/2020  1:48 PM 0.3 mL 12/09/2018 Intramuscular   Manufacturer: [place]   Lot: [id number]   [place]: [id number]

## 2020-06-21 LAB — HM MAMMOGRAPHY

## 2020-07-07 ENCOUNTER — Ambulatory Visit: Payer: Medicare PPO | Admitting: Family Medicine

## 2020-07-07 ENCOUNTER — Other Ambulatory Visit: Payer: Self-pay

## 2020-07-07 ENCOUNTER — Encounter: Payer: Self-pay | Admitting: Family Medicine

## 2020-07-07 VITALS — BP 112/77 | HR 83 | Temp 97.7°F | Ht 64.0 in | Wt 197.6 lb

## 2020-07-07 DIAGNOSIS — J4530 Mild persistent asthma, uncomplicated: Secondary | ICD-10-CM | POA: Diagnosis not present

## 2020-07-07 DIAGNOSIS — Z85828 Personal history of other malignant neoplasm of skin: Secondary | ICD-10-CM

## 2020-07-07 DIAGNOSIS — Z23 Encounter for immunization: Secondary | ICD-10-CM | POA: Diagnosis not present

## 2020-07-07 DIAGNOSIS — Z9889 Other specified postprocedural states: Secondary | ICD-10-CM | POA: Diagnosis not present

## 2020-07-07 DIAGNOSIS — Z780 Asymptomatic menopausal state: Secondary | ICD-10-CM | POA: Diagnosis not present

## 2020-07-07 MED ORDER — ALBUTEROL SULFATE HFA 108 (90 BASE) MCG/ACT IN AERS: 2.0000 | INHALATION_SPRAY | Freq: Four times a day (QID) | RESPIRATORY_TRACT | 4 refills | Status: DC | PRN

## 2020-07-07 MED ORDER — FLOVENT HFA 44 MCG/ACT IN AERO: 1.0000 | INHALATION_SPRAY | Freq: Every day | RESPIRATORY_TRACT | 12 refills | Status: AC

## 2020-07-07 MED ORDER — ALBUTEROL SULFATE HFA 108 (90 BASE) MCG/ACT IN AERS: 2.0000 | INHALATION_SPRAY | Freq: Four times a day (QID) | RESPIRATORY_TRACT | 4 refills | Status: AC | PRN

## 2020-07-07 NOTE — Patient Instructions (Addendum)
If you have lab work done today you will be contacted with your lab results within the next 2 weeks.  If you have not heard from [place] then please contact us. The fastest way to get your results is to register for My Chart.   IF you received an x-ray today, you will receive an invoice from [facility]. Please contact [facility] at [phone] with questions or concerns regarding your invoice.   IF you received labwork today, you will receive an invoice from [place]. Please contact [facility] at [phone] with questions or concerns regarding your invoice.   Our billing staff will not be able to assist you with questions regarding bills from these companies.  You will be contacted with the lab results as soon as they are available. The fastest way to get your results is to activate your My Chart account. Instructions are located on the last page of this paperwork. If you have not heard from [place] regarding the results in 2 weeks, please contact this office.     Bone Health Bones protect organs, store calcium, anchor muscles, and support the whole body. Keeping your bones strong is important, especially as you get older. You can take actions to help keep your bones strong and healthy. Why is keeping my bones healthy important?  Keeping your bones healthy is important because your body constantly replaces bone cells. Cells get old, and new cells take their place. As we age, we lose bone cells because the body may not be able to make enough new cells to replace the old cells. The amount of bone cells and bone tissue you have is referred to as bone mass. The higher your bone mass, the stronger your bones. The aging process leads to an overall loss of bone mass in the body, which can increase the likelihood of:  Joint pain and stiffness.  Broken bones.  A condition in which the bones become weak and brittle (osteoporosis). A large decline in bone mass occurs in older adults.  In women, it occurs about the time of menopause. What actions can I take to keep my bones healthy? Good health habits are important for maintaining healthy bones. This includes eating nutritious foods and exercising regularly. To have healthy bones, you need to get enough of the right minerals and vitamins. Most nutrition experts recommend getting these nutrients from the foods that you eat. In some cases, taking supplements may also be recommended. Doing certain types of exercise is also important for bone health. What are the nutritional recommendations for healthy bones?  Eating a well-balanced diet with plenty of calcium and vitamin D will help to protect your bones. Nutritional recommendations vary from person to person. Ask your health care provider what is healthy for you. Here are some general guidelines. Get enough calcium Calcium is the most important (essential) mineral for bone health. Most people can get enough calcium from their diet, but supplements may be recommended for people who are at risk for osteoporosis. Good sources of calcium include:  Dairy products, such as low-fat or nonfat milk, cheese, and yogurt.  Dark green leafy vegetables, such as bok choy and broccoli.  Calcium-fortified foods, such as orange juice, cereal, bread, soy beverages, and tofu products.  Nuts, such as almonds. Follow these recommended amounts for daily calcium intake:  Children, age 102-3: 700 mg.  Children, age 70-8: 1,000 mg.  Children, age 34-13: 1,300 mg.  Teens, age 53-18: 1,300 mg.  Adults, age 79-50: 29,000  mg.  Adults, age 82-70: ? Men: 1,000 mg. ? Women: 1,200 mg.  Adults, age 55 or older: 1,200 mg.  Pregnant and breastfeeding females: ? Teens: 1,300 mg. ? Adults: 1,000 mg. Get enough vitamin D Vitamin D is the most essential vitamin for bone health. It helps the body absorb calcium. Sunlight stimulates the skin to make vitamin D, so be sure to get enough sunlight. If you live in  a cold climate or you do not get outside often, your health care provider may recommend that you take vitamin D supplements. Good sources of vitamin D in your diet include:  Egg yolks.  Saltwater fish.  Milk and cereal fortified with vitamin D. Follow these recommended amounts for daily vitamin D intake:  Children and teens, age 29-18: 600 international units.  Adults, age 28 or younger: 400-800 international units.  Adults, age 78 or older: 800-1,000 international units. Get other important nutrients Other nutrients that are important for bone health include:  Phosphorus. This mineral is found in meat, poultry, dairy foods, nuts, and legumes. The recommended daily intake for adult men and adult women is 700 mg.  Magnesium. This mineral is found in seeds, nuts, dark green vegetables, and legumes. The recommended daily intake for adult men is 400-420 mg. For adult women, it is 310-320 mg.  Vitamin K. This vitamin is found in green leafy vegetables. The recommended daily intake is 120 mg for adult men and 90 mg for adult women. What type of physical activity is best for building and maintaining healthy bones? Weight-bearing and strength-building activities are important for building and maintaining healthy bones. Weight-bearing activities cause muscles and bones to work against gravity. Strength-building activities increase the strength of the muscles that support bones. Weight-bearing and muscle-building activities include:  Walking and hiking.  Jogging and running.  Dancing.  Gym exercises.  Lifting weights.  Tennis and racquetball.  Climbing stairs.  Aerobics. Adults should get at least 30 minutes of moderate physical activity on most days. Children should get at least 60 minutes of moderate physical activity on most days. Ask your health care provider what type of exercise is best for you. How can I find out if my bone mass is low? Bone mass can be measured with an X-ray  test called a bone mineral density (BMD) test. This test is recommended for all women who are age 71 or older. It may also be recommended for:  Men who are age 90 or older.  People who are at risk for osteoporosis because of: ? Having bones that break easily. ? Having a long-term disease that weakens bones, such as kidney disease or rheumatoid arthritis. ? Having menopause earlier than normal. ? Taking medicine that weakens bones, such as steroids, thyroid hormones, or hormone treatment for breast cancer or prostate cancer. ? Smoking. ? Drinking three or more alcoholic drinks a day. If you find that you have a low bone mass, you may be able to prevent osteoporosis or further bone loss by changing your diet and lifestyle. Where can I find more information? For more information, check out the following websites:  [place]: AviationTales.fr  [employer] of Health: www.bones.SouthExposed.es  International Osteoporosis Foundation: Administrator.iofbonehealth.org Summary  The aging process leads to an overall loss of bone mass in the body, which can increase the likelihood of broken bones and osteoporosis.  Eating a well-balanced diet with plenty of calcium and vitamin D will help to protect your bones.  Weight-bearing and strength-building activities are  also important for building and maintaining strong bones.  Bone mass can be measured with an X-ray test called a bone mineral density (BMD) test. This information is not intended to replace advice given to you by your health care provider. Make sure you discuss any questions you have with your health care provider. Document Revised: 10/28/2017 Document Reviewed: 10/28/2017 Elsevier Patient Education  [place].  Calcium Content in Foods Calcium is the most abundant mineral in your body. Most of your body's calcium supply is stored in your bones and teeth. Calcium helps many parts of the body function normally,  including:  Blood and blood vessels.  Nerves.  Hormones.  Muscles.  Bones and teeth. When your calcium stores are low, you may be at risk for low bone mass, bone loss, and broken bones (fractures). When you get enough calcium, it helps to support strong bones and teeth throughout your life. Calcium is especially important for:  Children during growth spurts.  Girls during adolescence.  Women who are pregnant or breastfeeding.  Women after their menstrual cycle stops (postmenopause).  Women whose menstrual cycle has stopped due to anorexia nervosa or regular intense exercise.  People who cannot eat or digest dairy products.  Vegans. What are tips for getting more calcium? General information  Try to get most of your calcium from food. Eat foods that are high in calcium.  Some people may benefit from taking calcium supplements. Check with your health care provider or diet and nutrition specialist (dietitian) before starting any calcium supplements. Calcium supplements may interact with certain medicines. Too much calcium may cause other health problems, like constipation and kidney stones.  For the body to absorb calcium, it needs vitamin D. Sources of vitamin D include: ? Skin exposure to direct sunlight. ? Foods, such as egg yolks, liver, saltwater fish, and fortified milk. ? Vitamin D supplements. Check with your health care provider or dietitian before starting any vitamin D supplements. What foods are high in calcium?  High-calcium foods are those that contain more than 100 milligrams (mg) of calcium per serving. Fruits  Fortified orange or other fruit juice, 300 mg per 8 oz serving. Vegetables  Collard greens, 360 mg per 8 oz serving.  Kale, 180 mg per 8 oz serving.  Bok choy, 160 mg per 8 oz serving. Grains  Fortified ready-to-eat cereals, 100-1,000 mg per 8 oz serving.  Fortified frozen waffles, 200 mg in two waffles. Meats and other proteins  Sardines,  canned with bones, 325 mg per 3 oz serving.  Salmon, canned with bones, 180 mg per 3 oz serving.  Canned shrimp, 125 mg per 3 oz serving.  Baked beans, 160 mg per 4 oz serving. Dairy  Yogurt, plain, low-fat, 310 mg per 6 oz serving.  Milk, 300 mg per 8 oz serving.  American cheese, 195 mg per 1 oz serving.  Cheddar cheese, 205 mg per 1 oz serving.  Cottage cheese 2%, 105 mg per 4 oz serving.  Fortified soy, rice, or almond milk, 300 mg per 8 oz serving. The items listed above may not be a complete list of foods high in calcium. Actual amounts of calcium may be different depending on processing. Contact a dietitian for more information. What foods are lower in calcium? Foods lower in calcium are those that contain 50 mg of calcium or less per serving. Fruits  Apple, about 6 mg in one apple.  Banana, about 12 mg in one banana. Vegetables  Lettuce, 19 mg per  2 oz serving.  Tomato, about 11 mg in one tomato. Grains  Rice, 4 mg per 6 oz serving.  Boiled potatoes, 14 mg per 8 oz serving.  White bread, 6 mg in one slice. Meats and other proteins  Egg, 27 mg per 2 oz serving.  Red meat, 7 mg per 4 oz serving.  Chicken, 17 mg per 4 oz serving.  Fish, cod or trout, 20 mg per 4 oz serving. The items listed above may not be a complete list of foods lower in calcium. Actual amounts of calcium may be different depending on processing. Contact a dietitian for more information. Summary  Calcium is an important mineral in the body because it affects many functions. Getting enough calcium helps support strong bones and teeth throughout your life.  Try to get most of your calcium from food.  Calcium supplements may interact with certain medicines. Check with your health care provider before starting any calcium supplements. This information is not intended to replace advice given to you by your health care provider. Make sure you discuss any questions you have with your health  care provider. Document Revised: 09/24/2017 Document Reviewed: 09/24/2017 Elsevier Patient Education  2020 [employer].

## 2020-07-07 NOTE — Progress Notes (Signed)
9/23/20214:48 PM  Jody [name] [birth year], 64 y.o., female [id number]  Chief Complaint  Patient presents with  . Medication Refill    wants to talk about the options of inhaler, has letter of non approval  . Immunizations    shingles rx and p13    HPI:   Patient is a 64 y.o. female with past medical history significant for asthma who presents today for medication refill  Last OV feb 2020  Asthma diagnosed several years ago She has slowly been weaning off her asthma meds She is currently taking qvar 37mcg once a day She tried to wean off completely and had SOB and wheezing resumed She has not needed her albuterol in months, needs refill as current one expired qvar is not covered by her insurance She has enough for about a year but she is requesting new rx that covered by insurance  She has h/o basal cell carcinomas Needed mohs surgery on nose She is requesting referral to derm for routine skin care  She takes vitamin D daily Gets calcium thru her diet  Depression screen [facility] 2/9 10/29/2018 02/05/2017 10/02/2016  Decreased Interest 0 0 0  Down, Depressed, Hopeless 0 0 0  PHQ - 2 Score 0 0 0    Fall Risk  12/08/2018 10/29/2018 08/22/2018 06/19/2018 02/05/2017  Falls in the past year? 0 0 0 Yes No  Number falls in past yr: 0 - - 1 -  Injury with Fall? 0 - - Yes -  Comment - - - - -  Follow up Falls evaluation completed - - - -     Allergies  Allergen Reactions  . Aspirin     Bleeding  . Penicillins Hives    Did it involve swelling of the face/tongue/throat, SOB, or low BP? No Did it involve sudden or severe rash/hives, skin peeling, or any reaction on the inside of your mouth or nose? No Did you need to seek medical attention at a hospital or doctor's office? Yes When did it last happen?Over 10 years ago If all above answers are "NO", may proceed with cephalosporin use.   . Sulfa Antibiotics Hives    Prior to Admission medications   Medication Sig Start  Date End Date Taking? Authorizing Provider  albuterol (PROVENTIL HFA;VENTOLIN HFA) 108 (90 Base) MCG/ACT inhaler Inhale 2 puffs into the lungs every 6 (six) hours as needed for wheezing. 06/19/18  Yes Weber, Sarah L, PA-C  beclomethasone (QVAR REDIHALER) 40 MCG/ACT inhaler INHALE 1 PUFF INTO THE LUNGS 2 (TWO) TIMES DAILY. 07/14/19  Yes Rutherford Guys, MD  Methylcellulose, Laxative, (CITRUCEL PO) Take 1 tablet by mouth daily.    Yes [provider]  Multiple Vitamins-Minerals (CENTRUM SILVER ULTRA WOMENS PO) Take 1 tablet by mouth daily.   Yes [provider]  Probiotic Product (PROBIOTIC DAILY PO) Take 1 capsule by mouth daily. Ultra Flora Balance   Yes [provider]  Saline (AYR NA) Place into the nose.   Yes [provider]  Ascorbic Acid (VITAMIN C PO) Take 1,000 mg by mouth daily.  Patient not taking: Reported on 07/07/2020    [provider]    Past Medical History:  Diagnosis Date  . Allergy   . Asthma   . Cancer (HCC)    basil cell carcinoma, Nose  . Depression    loss of both parents  . Diverticulosis   . Fibroids   . GERD (gastroesophageal reflux disease)    HISTORY OF, occ  .  History of iron deficiency anemia   . History of varicose veins   . Spigelian hernia   . Umbilical hernia   . Uterine polyp    history of    Past Surgical History:  Procedure Laterality Date  . COLONOSCOPY    . DILATION AND CURETTAGE OF UTERUS    . INCISION AND DRAINAGE ABSCESS Left    groin area  . MOHS SURGERY    . TONSILLECTOMY     64 yrs old  . VARICOSE VEIN SURGERY    . XI ROBOTIC ASSISTED VENTRAL HERNIA N/A 06/12/2019   Procedure: ROBOTIC UMBILICAL AND SPIGELIAN HERNIA REPAIR WITH MESH;  Surgeon: Ralene Ok, MD;  Location: [facility];  Service: General;  Laterality: N/A;    Social History   Tobacco Use  . Smoking status: Never Smoker  . Smokeless tobacco: Never Used  Substance Use Topics  . Alcohol use: Not Currently    Alcohol/week:  0.0 standard drinks    Comment: once or twice a year    Family History  Problem Relation Age of Onset  . Diabetes Mother   . Hypertension Mother   . Cancer Mother   . Diabetes Father   . Hypertension Father   . Leukemia Father     Review of Systems  Constitutional: Negative for chills and fever.  Respiratory: Negative for cough and shortness of breath.   Cardiovascular: Negative for chest pain, palpitations and leg swelling.  Gastrointestinal: Negative for abdominal pain, nausea and vomiting.   Per hpi  OBJECTIVE:  Today's Vitals   07/07/20 1621  BP: 112/77  Pulse: 83  Temp: 97.7 F (36.5 C)  SpO2: 95%  Weight: 197 lb 9.6 oz (89.6 kg)  Height: 5\' 4"  (1.626 m)   Body mass index is 33.92 kg/m.   Physical Exam Vitals and nursing note reviewed.  Constitutional:      Appearance: She is well-developed.  HENT:     Head: Normocephalic and atraumatic.     Mouth/Throat:     Pharynx: No oropharyngeal exudate.  Eyes:     General: No scleral icterus.    Extraocular Movements: Extraocular movements intact.     Conjunctiva/sclera: Conjunctivae normal.     Pupils: Pupils are equal, round, and reactive to light.  Cardiovascular:     Rate and Rhythm: Normal rate and regular rhythm.     Heart sounds: Normal heart sounds. No murmur heard.  No friction rub. No gallop.   Pulmonary:     Effort: Pulmonary effort is normal.     Breath sounds: Normal breath sounds. No wheezing, rhonchi or rales.  Musculoskeletal:     Cervical back: Neck supple.  Skin:    General: Skin is warm and dry.  Neurological:     Mental Status: She is alert and oriented to person, place, and time.     No results found for this or any previous visit (from the past 24 hour(s)).  No results found.   ASSESSMENT and PLAN  1. Mild persistent asthma without complication Well controlled on very low dose ICS. Changing to flovent per insurance coverage.  - albuterol (VENTOLIN HFA) 108 (90 Base) MCG/ACT  inhaler; Inhale 2 puffs into the lungs every 6 (six) hours as needed for wheezing.  2. Postmenopausal estrogen deficiency Discussed healthy bone LFM at length Patient educational handouts given - DG Bone Density; Future  3. H/O basal cell carcinoma excision - Ambulatory referral to Dermatology  4. Need for vaccination prevnar 13 today shingrix at pharmacy  of choice  Other orders - Pneumococcal conjugate vaccine 13-valent IM - fluticasone (FLOVENT HFA) 44 MCG/ACT inhaler; Inhale 1 puff into the lungs daily.  No follow-ups on file.    Rutherford Guys, MD [facility] [place], [place] [address] Ph.  [phone] Fax [phone]

## 2022-02-14 DIAGNOSIS — E559 Vitamin D deficiency, unspecified: Secondary | ICD-10-CM | POA: Diagnosis not present

## 2022-02-14 DIAGNOSIS — Z9109 Other allergy status, other than to drugs and biological substances: Secondary | ICD-10-CM | POA: Diagnosis not present

## 2022-02-14 DIAGNOSIS — Z7185 Encounter for immunization safety counseling: Secondary | ICD-10-CM | POA: Diagnosis not present

## 2022-02-14 DIAGNOSIS — J302 Other seasonal allergic rhinitis: Secondary | ICD-10-CM | POA: Diagnosis not present

## 2022-02-14 DIAGNOSIS — L820 Inflamed seborrheic keratosis: Secondary | ICD-10-CM | POA: Diagnosis not present

## 2022-02-14 DIAGNOSIS — L720 Epidermal cyst: Secondary | ICD-10-CM | POA: Diagnosis not present

## 2022-02-14 DIAGNOSIS — Z136 Encounter for screening for cardiovascular disorders: Secondary | ICD-10-CM | POA: Diagnosis not present

## 2022-02-14 DIAGNOSIS — L821 Other seborrheic keratosis: Secondary | ICD-10-CM | POA: Diagnosis not present

## 2022-02-14 DIAGNOSIS — Z131 Encounter for screening for diabetes mellitus: Secondary | ICD-10-CM | POA: Diagnosis not present

## 2022-02-14 DIAGNOSIS — Z85828 Personal history of other malignant neoplasm of skin: Secondary | ICD-10-CM | POA: Diagnosis not present

## 2022-02-14 DIAGNOSIS — D2271 Melanocytic nevi of right lower limb, including hip: Secondary | ICD-10-CM | POA: Diagnosis not present

## 2022-02-14 DIAGNOSIS — J4530 Mild persistent asthma, uncomplicated: Secondary | ICD-10-CM | POA: Diagnosis not present

## 2022-02-14 DIAGNOSIS — Z13220 Encounter for screening for lipoid disorders: Secondary | ICD-10-CM | POA: Diagnosis not present

## 2022-02-14 DIAGNOSIS — Z79899 Other long term (current) drug therapy: Secondary | ICD-10-CM | POA: Diagnosis not present

## 2022-02-14 DIAGNOSIS — E669 Obesity, unspecified: Secondary | ICD-10-CM | POA: Diagnosis not present

## 2022-07-03 DIAGNOSIS — Z780 Asymptomatic menopausal state: Secondary | ICD-10-CM | POA: Diagnosis not present

## 2022-07-03 DIAGNOSIS — M8589 Other specified disorders of bone density and structure, multiple sites: Secondary | ICD-10-CM | POA: Diagnosis not present

## 2022-07-03 DIAGNOSIS — Z1231 Encounter for screening mammogram for malignant neoplasm of breast: Secondary | ICD-10-CM | POA: Diagnosis not present

## 2022-07-11 DIAGNOSIS — J4530 Mild persistent asthma, uncomplicated: Secondary | ICD-10-CM | POA: Diagnosis not present

## 2022-07-11 DIAGNOSIS — E559 Vitamin D deficiency, unspecified: Secondary | ICD-10-CM | POA: Diagnosis not present

## 2022-07-11 DIAGNOSIS — M8589 Other specified disorders of bone density and structure, multiple sites: Secondary | ICD-10-CM | POA: Diagnosis not present

## 2022-07-11 DIAGNOSIS — E7800 Pure hypercholesterolemia, unspecified: Secondary | ICD-10-CM | POA: Diagnosis not present

## 2022-07-11 DIAGNOSIS — Z0000 Encounter for general adult medical examination without abnormal findings: Secondary | ICD-10-CM | POA: Diagnosis not present

## 2022-07-11 DIAGNOSIS — Z23 Encounter for immunization: Secondary | ICD-10-CM | POA: Diagnosis not present

## 2023-01-09 DIAGNOSIS — J302 Other seasonal allergic rhinitis: Secondary | ICD-10-CM | POA: Diagnosis not present

## 2023-01-09 DIAGNOSIS — M8580 Other specified disorders of bone density and structure, unspecified site: Secondary | ICD-10-CM | POA: Diagnosis not present

## 2023-01-09 DIAGNOSIS — J4530 Mild persistent asthma, uncomplicated: Secondary | ICD-10-CM | POA: Diagnosis not present

## 2023-01-09 DIAGNOSIS — Z9181 History of falling: Secondary | ICD-10-CM | POA: Diagnosis not present

## 2023-01-09 DIAGNOSIS — E785 Hyperlipidemia, unspecified: Secondary | ICD-10-CM | POA: Diagnosis not present

## 2023-02-19 DIAGNOSIS — L821 Other seborrheic keratosis: Secondary | ICD-10-CM | POA: Diagnosis not present

## 2023-02-19 DIAGNOSIS — Z85828 Personal history of other malignant neoplasm of skin: Secondary | ICD-10-CM | POA: Diagnosis not present

## 2023-02-19 DIAGNOSIS — L218 Other seborrheic dermatitis: Secondary | ICD-10-CM | POA: Diagnosis not present

## 2023-02-19 DIAGNOSIS — D2362 Other benign neoplasm of skin of left upper limb, including shoulder: Secondary | ICD-10-CM | POA: Diagnosis not present

## 2023-04-25 DIAGNOSIS — K5790 Diverticulosis of intestine, part unspecified, without perforation or abscess without bleeding: Secondary | ICD-10-CM | POA: Diagnosis not present

## 2023-04-25 DIAGNOSIS — R1031 Right lower quadrant pain: Secondary | ICD-10-CM | POA: Diagnosis not present

## 2023-04-25 DIAGNOSIS — R399 Unspecified symptoms and signs involving the genitourinary system: Secondary | ICD-10-CM | POA: Diagnosis not present

## 2023-04-25 DIAGNOSIS — R195 Other fecal abnormalities: Secondary | ICD-10-CM | POA: Diagnosis not present

## 2023-04-26 ENCOUNTER — Encounter: Payer: Self-pay | Admitting: Family Medicine

## 2023-04-26 ENCOUNTER — Other Ambulatory Visit: Payer: Self-pay | Admitting: Family Medicine

## 2023-04-26 DIAGNOSIS — R1031 Right lower quadrant pain: Secondary | ICD-10-CM

## 2023-05-02 ENCOUNTER — Other Ambulatory Visit: Payer: Medicare PPO

## 2023-05-02 ENCOUNTER — Other Ambulatory Visit: Payer: Self-pay | Admitting: Family Medicine

## 2023-05-02 DIAGNOSIS — R1031 Right lower quadrant pain: Secondary | ICD-10-CM

## 2023-07-09 DIAGNOSIS — Z1231 Encounter for screening mammogram for malignant neoplasm of breast: Secondary | ICD-10-CM | POA: Diagnosis not present

## 2023-07-24 DIAGNOSIS — Z0000 Encounter for general adult medical examination without abnormal findings: Secondary | ICD-10-CM | POA: Diagnosis not present

## 2023-07-24 DIAGNOSIS — E7800 Pure hypercholesterolemia, unspecified: Secondary | ICD-10-CM | POA: Diagnosis not present

## 2023-07-24 DIAGNOSIS — M8589 Other specified disorders of bone density and structure, multiple sites: Secondary | ICD-10-CM | POA: Diagnosis not present

## 2023-07-24 DIAGNOSIS — Z1211 Encounter for screening for malignant neoplasm of colon: Secondary | ICD-10-CM | POA: Diagnosis not present

## 2023-07-24 DIAGNOSIS — Z79899 Other long term (current) drug therapy: Secondary | ICD-10-CM | POA: Diagnosis not present

## 2023-07-24 DIAGNOSIS — Z131 Encounter for screening for diabetes mellitus: Secondary | ICD-10-CM | POA: Diagnosis not present

## 2023-07-24 DIAGNOSIS — J302 Other seasonal allergic rhinitis: Secondary | ICD-10-CM | POA: Diagnosis not present

## 2023-07-24 DIAGNOSIS — J4530 Mild persistent asthma, uncomplicated: Secondary | ICD-10-CM | POA: Diagnosis not present

## 2023-08-01 DIAGNOSIS — J45998 Other asthma: Secondary | ICD-10-CM | POA: Diagnosis not present

## 2023-08-01 DIAGNOSIS — R152 Fecal urgency: Secondary | ICD-10-CM | POA: Diagnosis not present

## 2023-08-01 DIAGNOSIS — R194 Change in bowel habit: Secondary | ICD-10-CM | POA: Diagnosis not present

## 2023-08-01 DIAGNOSIS — K5730 Diverticulosis of large intestine without perforation or abscess without bleeding: Secondary | ICD-10-CM | POA: Diagnosis not present

## 2023-08-01 DIAGNOSIS — E669 Obesity, unspecified: Secondary | ICD-10-CM | POA: Diagnosis not present

## 2023-08-01 DIAGNOSIS — Z1211 Encounter for screening for malignant neoplasm of colon: Secondary | ICD-10-CM | POA: Diagnosis not present

## 2023-08-01 DIAGNOSIS — Z860100 Personal history of colon polyps, unspecified: Secondary | ICD-10-CM | POA: Diagnosis not present

## 2023-08-07 ENCOUNTER — Emergency Department (HOSPITAL_BASED_OUTPATIENT_CLINIC_OR_DEPARTMENT_OTHER): Payer: Medicare PPO

## 2023-08-07 ENCOUNTER — Emergency Department (HOSPITAL_BASED_OUTPATIENT_CLINIC_OR_DEPARTMENT_OTHER)
Admission: EM | Admit: 2023-08-07 | Discharge: 2023-08-07 | Disposition: A | Discharge status: Home / Self Care | Payer: Medicare PPO | Attending: Emergency Medicine | Admitting: Emergency Medicine

## 2023-08-07 ENCOUNTER — Other Ambulatory Visit: Payer: Self-pay

## 2023-08-07 ENCOUNTER — Encounter (HOSPITAL_BASED_OUTPATIENT_CLINIC_OR_DEPARTMENT_OTHER): Payer: Self-pay | Admitting: Radiology

## 2023-08-07 DIAGNOSIS — N200 Calculus of kidney: Secondary | ICD-10-CM | POA: Insufficient documentation

## 2023-08-07 DIAGNOSIS — Z7951 Long term (current) use of inhaled steroids: Secondary | ICD-10-CM | POA: Diagnosis not present

## 2023-08-07 DIAGNOSIS — R109 Unspecified abdominal pain: Secondary | ICD-10-CM | POA: Diagnosis not present

## 2023-08-07 DIAGNOSIS — N201 Calculus of ureter: Secondary | ICD-10-CM | POA: Diagnosis not present

## 2023-08-07 DIAGNOSIS — K449 Diaphragmatic hernia without obstruction or gangrene: Secondary | ICD-10-CM | POA: Diagnosis not present

## 2023-08-07 DIAGNOSIS — D259 Leiomyoma of uterus, unspecified: Secondary | ICD-10-CM | POA: Diagnosis not present

## 2023-08-07 DIAGNOSIS — J45909 Unspecified asthma, uncomplicated: Secondary | ICD-10-CM | POA: Diagnosis not present

## 2023-08-07 DIAGNOSIS — M545 Low back pain, unspecified: Secondary | ICD-10-CM | POA: Diagnosis not present

## 2023-08-07 DIAGNOSIS — N134 Hydroureter: Secondary | ICD-10-CM | POA: Diagnosis not present

## 2023-08-07 LAB — CBC
HCT: 42.6 % (ref 36.0–46.0)
Hemoglobin: 14.1 g/dL (ref 12.0–15.0)
MCH: 29.5 pg (ref 26.0–34.0)
MCHC: 33.1 g/dL (ref 30.0–36.0)
MCV: 89.1 fL (ref 80.0–100.0)
Platelets: 303 10*3/uL (ref 150–400)
RBC: 4.78 MIL/uL (ref 3.87–5.11)
RDW: 13.9 % (ref 11.5–15.5)
WBC: 9.5 10*3/uL (ref 4.0–10.5)
nRBC: 0 % (ref 0.0–0.2)

## 2023-08-07 LAB — URINALYSIS, ROUTINE W REFLEX MICROSCOPIC
Bacteria, UA: NONE SEEN
Bilirubin Urine: NEGATIVE
Glucose, UA: NEGATIVE mg/dL
Ketones, ur: 15 mg/dL — AB
Leukocytes,Ua: NEGATIVE
Nitrite: NEGATIVE
Protein, ur: NEGATIVE mg/dL
Specific Gravity, Urine: 1.01 (ref 1.005–1.030)
Trans Epithel, UA: <1
pH: 6 (ref 5.0–8.0)

## 2023-08-07 LAB — BASIC METABOLIC PANEL
Anion gap: 9 (ref 5–15)
BUN: 19 mg/dL (ref 8–23)
CO2: 27 mmol/L (ref 22–32)
Calcium: 10 mg/dL (ref 8.9–10.3)
Chloride: 101 mmol/L (ref 98–111)
Creatinine, Ser: 1.09 mg/dL — ABNORMAL HIGH (ref 0.44–1.00)
GFR, Estimated: 55 mL/min — ABNORMAL LOW (ref >60)
Glucose, Bld: 108 mg/dL — ABNORMAL HIGH (ref 70–99)
Potassium: 3.7 mmol/L (ref 3.5–5.1)
Sodium: 137 mmol/L (ref 135–145)

## 2023-08-07 MED ORDER — ONDANSETRON 4 MG PO TBDP
4.0000 mg | ORAL_TABLET | Freq: Once | ORAL | Status: DC
  Filled 2023-08-07: qty 1

## 2023-08-07 MED ORDER — ONDANSETRON 4 MG PO TBDP
4.0000 mg | ORAL_TABLET | Freq: Three times a day (TID) | ORAL | 0 refills | Status: AC | PRN
Start: 2023-08-07 — End: 2023-09-06

## 2023-08-07 MED ORDER — KETOROLAC TROMETHAMINE 15 MG/ML IJ SOLN
15.0000 mg | Freq: Once | INTRAMUSCULAR | Status: AC
  Administered 2023-08-07: 15 mg via INTRAVENOUS
  Filled 2023-08-07: qty 1

## 2023-08-07 MED ORDER — OXYCODONE-ACETAMINOPHEN 5-325 MG PO TABS: 1.0000 | ORAL_TABLET | Freq: Three times a day (TID) | ORAL | 0 refills | Status: AC | PRN

## 2023-08-07 MED ORDER — IOHEXOL 300 MG/ML  SOLN
100.0000 mL | Freq: Once | INTRAMUSCULAR | Status: AC | PRN
  Administered 2023-08-07: 85 mL via INTRAVENOUS

## 2023-08-07 MED ORDER — OXYCODONE-ACETAMINOPHEN 5-325 MG PO TABS
1.0000 | ORAL_TABLET | Freq: Once | ORAL | Status: DC
  Filled 2023-08-07: qty 1

## 2023-08-07 MED ORDER — ONDANSETRON HCL 4 MG/2ML IJ SOLN
4.0000 mg | Freq: Once | INTRAMUSCULAR | Status: AC
  Administered 2023-08-07: 4 mg via INTRAVENOUS
  Filled 2023-08-07: qty 2

## 2023-08-07 MED ORDER — SODIUM CHLORIDE 0.9 % IV BOLUS
1000.0000 mL | Freq: Once | INTRAVENOUS | Status: AC
  Administered 2023-08-07: 1000 mL via INTRAVENOUS

## 2023-08-07 NOTE — Discharge Instructions (Addendum)
As discussed, you have a 2 mm kidney stone the distal aspect of your left ureter.  Alternate between Tylenol and Ibuprofen every 4 hours for pain.  You can take Percocet for breakthrough through pain.  Take this up to 3 times a day.  I have also sent a prescription for Zofran.  You can take this every 8 hours as needed for nausea and vomiting. You should pass the stone in 10-14 days,  Follow-up with your PCP in 1 week for reevaluation.  Return to the ED if your pain worsens in the interim.

## 2023-08-07 NOTE — ED Provider Notes (Signed)
Jody [name] EMERGENCY DEPARTMENT AT [facility] Provider Note   CSN: [id number] Arrival date & time: 08/07/23  1345     History {Add pertinent medical, surgical, social history, OB history to HPI:1} Chief Complaint  Patient presents with   Flank Pain    Left    Jody [name] is a 67 y.o. female.   Flank Pain   67 year old female presents emergency department complaints of left-sided flank pain.  Patient states symptoms began yesterday around noon when she was at a yoga class.  States that she was not do anything strenuous and noted sudden onset sharp left-sided low back pain.  States that pain has been intermittently better as well as worsening since onset.  Reports nausea with associated vomiting.  Was seen by [facility] primary care in the outpatient setting and was found to have left-sided CVA tenderness as well as a blood in the urine and sent to the emergency department due to concerns for possible kidney stone.  Patient denies any fever, chills, dysuria, urinary frequency or noticeable hematuria.  Patient does state that if she has felt somewhat more constipated with last bowel movement being on Friday.  States that she usually can go 2 to 3 days in between bowel movements but usually has to take MiraLAX.  States that due to feelings of nausea and intermittent vomiting, has been able to tolerate much by mouth and not able to take her at home MiraLAX to help invoke bowel movement.  Denies any history of similar symptoms in the past.  Past medical history significant for diverticula of colon, asthma, seasonal allergies, GERD, iron deficiency anemia, malignancy,  Home Medications Prior to Admission medications   Medication Sig Start Date End Date Taking? Authorizing Provider  albuterol (VENTOLIN HFA) 108 (90 Base) MCG/ACT inhaler Inhale 2 puffs into the lungs every 6 (six) hours as needed for wheezing. 07/07/20   Lezlie Lye, Meda Coffee, MD  Ascorbic Acid (VITAMIN C PO) Take 1,000  mg by mouth daily.  Patient not taking: Reported on 07/07/2020    [provider]  fluticasone (FLOVENT HFA) 44 MCG/ACT inhaler Inhale 1 puff into the lungs daily. 07/07/20   Noni Saupe, MD  Methylcellulose, Laxative, (CITRUCEL PO) Take 1 tablet by mouth daily.     [provider]  Multiple Vitamins-Minerals (CENTRUM SILVER ULTRA WOMENS PO) Take 1 tablet by mouth daily.    [provider]  Probiotic Product (PROBIOTIC DAILY PO) Take 1 capsule by mouth daily. Ultra Flora Balance    [provider]  Saline (AYR NA) Place into the nose.    [provider]      Allergies    Aspirin, Penicillins, and Sulfa antibiotics    Review of Systems   Review of Systems  Genitourinary:  Positive for flank pain.  All other systems reviewed and are negative.   Physical Exam Updated Vital Signs BP 123/69   Pulse 72   Temp 97.6 F (36.4 C)   Resp 18   Wt 87.5 kg   SpO2 94%   BMI 33.13 kg/m  Physical Exam Vitals and nursing note reviewed.  Constitutional:      General: She is not in acute distress.    Appearance: She is well-developed.  HENT:     Head: Normocephalic and atraumatic.  Eyes:     Conjunctiva/sclera: Conjunctivae normal.  Cardiovascular:     Rate and Rhythm: Normal rate and regular rhythm.     Heart sounds: No murmur heard.  Pulmonary:     Effort: Pulmonary effort is normal. No respiratory distress.     Breath sounds: Normal breath sounds.  Abdominal:     General: There is no distension.     Palpations: Abdomen is soft.     Tenderness: There is no abdominal tenderness. There is left CVA tenderness. There is no right CVA tenderness or guarding.  Musculoskeletal:        General: No swelling.     Cervical back: Neck supple.  Skin:    General: Skin is warm and dry.     Capillary Refill: Capillary refill takes less than 2 seconds.  Neurological:     Mental Status: She is alert.  Psychiatric:        Mood and Affect: Mood  normal.     ED Results / Procedures / Treatments   Labs (all labs ordered are listed, but only abnormal results are displayed) Labs Reviewed  URINALYSIS, ROUTINE W REFLEX MICROSCOPIC - Abnormal; Notable for the following components:      Result Value   Hgb urine dipstick MODERATE (*)    Ketones, ur 15 (*)    All other components within normal limits  BASIC METABOLIC PANEL - Abnormal; Notable for the following components:   Glucose, Bld 108 (*)    Creatinine, Ser 1.09 (*)    GFR, Estimated 55 (*)    All other components within normal limits  CBC    EKG None  Radiology No results found.  Procedures Procedures  {Document cardiac monitor, telemetry assessment procedure when appropriate:1}  Medications Ordered in ED Medications  sodium chloride 0.9 % bolus 1,000 mL (1,000 mLs Intravenous New Bag/Given 08/07/23 1623)  ketorolac (TORADOL) 15 MG/ML injection 15 mg (15 mg Intravenous Given 08/07/23 1620)  ondansetron (ZOFRAN) injection 4 mg (4 mg Intravenous Given 08/07/23 1620)    ED Course/ Medical Decision Making/ A&P   {   Click here for ABCD2, HEART and other calculatorsREFRESH Note before signing :1}                              Medical Decision Making Amount and/or Complexity of Data Reviewed Labs: ordered. Radiology: ordered.  Risk Prescription drug management.   This patient presents to the ED for concern of flank pain, this involves an extensive number of treatment options, and is a complaint that carries with it a high risk of complications and morbidity.  The differential diagnosis includes pyelonephritis, nephrolithiasis, cystitis, musculoskeletal, aortic dissection, aortic aneurysm, especially Sobia, volvulus, gastritis, PUD, CBD pathology, cholecystitis, other   Co morbidities that complicate the patient evaluation  See HPI   Additional history obtained:  Additional history obtained from EMR External records from outside source obtained and  reviewed including hospital records   Lab Tests:  I Ordered, and personally interpreted labs.  The pertinent results include: No leukocytosis.  No evidence of anemia.  Platelets within range.  No electrolyte abnormalities.  Mild elevation of creatinine 1.09 with GFR of 55.  UA significant for 11-20 RBCs with 15 ketones and moderate hemoglobin but otherwise unremarkable.   Imaging Studies ordered:  I ordered imaging studies including CT abdomen pelvis I independently visualized and interpreted imaging which showed *** I agree with the radiologist interpretation   Cardiac Monitoring: / EKG:  The patient was maintained on a cardiac monitor.  I personally viewed and interpreted the cardiac monitored which showed an underlying rhythm of: Sinus rhythm   Consultations  Obtained:  N/a   Problem List / ED Course / Critical interventions / Medication management  Flank pain I ordered medication including normal saline, Toradol, Zofran  Reevaluation of the patient after these medicines showed that the patient improved I have reviewed the patients home medicines and have made adjustments as needed   Social Determinants of Health:  Denies tobacco, licit drug use   Test / Admission - Considered:  Flank pain Vitals signs within normal range and stable throughout visit. Laboratory/imaging studies significant for: See above *** Worrisome signs and symptoms were discussed with the patient, and the patient acknowledged understanding to return to the ED if noticed. Patient was stable upon discharge.    {Document critical care time when appropriate:1} {Document review of labs and clinical decision tools ie heart score, Chads2Vasc2 etc:1}  {Document your independent review of radiology images, and any outside records:1} {Document your discussion with family members, caretakers, and with consultants:1} {Document social determinants of health affecting pt's care:1} {Document your decision  making why or why not admission, treatments were needed:1} Final Clinical Impression(s) / ED Diagnoses Final diagnoses:  None    Rx / DC Orders ED Discharge Orders     None

## 2023-08-07 NOTE — ED Notes (Addendum)
Reports left flank pain 24hrs. Told by UC had blood in urine. Was told xray was not clear if renal stone present- did notice large stool burden. Sent by PCP. Last BM yesterday. No dysuria.

## 2023-08-07 NOTE — ED Provider Notes (Signed)
  Physical Exam  BP 102/77   Pulse 92   Temp 97.9 F (36.6 C) (Oral)   Resp 20   Wt 87.5 kg   SpO2 97%   BMI 33.13 kg/m   Physical Exam  Procedures  Procedures  ED Course / MDM    Medical Decision Making Amount and/or Complexity of Data Reviewed Labs: ordered. Radiology: ordered.  Risk Prescription drug management.   Patient care taken over at shift change.  Disposition pending CT results.  CT showed: 1. Small obstructing calculus in the distal LEFT ureter measuring 2 mm. Mild LEFT hydroureter and perinephric stranding. 2. Normal RIGHT kidney. 3. Benign uterine leiomyoma. 4. Small hiatal hernia.  Updated patient on the findings.  She states that she was feeling a lot better after the medications.  Patient is hemodynamically stable and safe for discharge home. Prescription sent to the pharmacy. Return precautions provided.   Maxwell Marion, PA-C 08/07/23 2101    Elayne Snare K, DO 08/07/23 2324

## 2023-10-28 DIAGNOSIS — K5730 Diverticulosis of large intestine without perforation or abscess without bleeding: Secondary | ICD-10-CM | POA: Diagnosis not present

## 2023-10-28 DIAGNOSIS — Z860101 Personal history of adenomatous and serrated colon polyps: Secondary | ICD-10-CM | POA: Diagnosis not present

## 2023-10-28 DIAGNOSIS — Z860100 Personal history of colon polyps, unspecified: Secondary | ICD-10-CM | POA: Diagnosis not present

## 2023-10-28 DIAGNOSIS — Z1211 Encounter for screening for malignant neoplasm of colon: Secondary | ICD-10-CM | POA: Diagnosis not present

## 2024-01-22 DIAGNOSIS — R8290 Unspecified abnormal findings in urine: Secondary | ICD-10-CM | POA: Diagnosis not present

## 2024-01-22 DIAGNOSIS — J4530 Mild persistent asthma, uncomplicated: Secondary | ICD-10-CM | POA: Diagnosis not present

## 2024-01-22 DIAGNOSIS — M8580 Other specified disorders of bone density and structure, unspecified site: Secondary | ICD-10-CM | POA: Diagnosis not present

## 2024-01-22 DIAGNOSIS — J302 Other seasonal allergic rhinitis: Secondary | ICD-10-CM | POA: Diagnosis not present

## 2024-03-05 DIAGNOSIS — D225 Melanocytic nevi of trunk: Secondary | ICD-10-CM | POA: Diagnosis not present

## 2024-03-05 DIAGNOSIS — L905 Scar conditions and fibrosis of skin: Secondary | ICD-10-CM | POA: Diagnosis not present

## 2024-03-05 DIAGNOSIS — L821 Other seborrheic keratosis: Secondary | ICD-10-CM | POA: Diagnosis not present

## 2024-03-05 DIAGNOSIS — Z85828 Personal history of other malignant neoplasm of skin: Secondary | ICD-10-CM | POA: Diagnosis not present

## 2024-06-24 DIAGNOSIS — H35371 Puckering of macula, right eye: Secondary | ICD-10-CM | POA: Diagnosis not present

## 2024-06-24 DIAGNOSIS — H2513 Age-related nuclear cataract, bilateral: Secondary | ICD-10-CM | POA: Diagnosis not present

## 2024-06-24 DIAGNOSIS — H43813 Vitreous degeneration, bilateral: Secondary | ICD-10-CM | POA: Diagnosis not present

## 2024-07-14 DIAGNOSIS — Z1231 Encounter for screening mammogram for malignant neoplasm of breast: Secondary | ICD-10-CM | POA: Diagnosis not present

## 2024-07-14 DIAGNOSIS — M8589 Other specified disorders of bone density and structure, multiple sites: Secondary | ICD-10-CM | POA: Diagnosis not present

## 2024-07-31 DIAGNOSIS — M8589 Other specified disorders of bone density and structure, multiple sites: Secondary | ICD-10-CM | POA: Diagnosis not present

## 2024-07-31 DIAGNOSIS — Z131 Encounter for screening for diabetes mellitus: Secondary | ICD-10-CM | POA: Diagnosis not present

## 2024-07-31 DIAGNOSIS — Z6834 Body mass index (BMI) 34.0-34.9, adult: Secondary | ICD-10-CM | POA: Diagnosis not present

## 2024-07-31 DIAGNOSIS — Z79899 Other long term (current) drug therapy: Secondary | ICD-10-CM | POA: Diagnosis not present

## 2024-07-31 DIAGNOSIS — J4530 Mild persistent asthma, uncomplicated: Secondary | ICD-10-CM | POA: Diagnosis not present

## 2024-07-31 DIAGNOSIS — E7800 Pure hypercholesterolemia, unspecified: Secondary | ICD-10-CM | POA: Diagnosis not present

## 2024-07-31 DIAGNOSIS — J302 Other seasonal allergic rhinitis: Secondary | ICD-10-CM | POA: Diagnosis not present

## 2024-07-31 DIAGNOSIS — Z23 Encounter for immunization: Secondary | ICD-10-CM | POA: Diagnosis not present

## 2024-07-31 DIAGNOSIS — Z0000 Encounter for general adult medical examination without abnormal findings: Secondary | ICD-10-CM | POA: Diagnosis not present
